# Patient Record
Sex: Female | Born: 1950 | Race: Black or African American | Hispanic: No | Smoking: Former smoker
Health system: Southern US, Community
[De-identification: ages and names within clinical notes are randomized; demographics above are authoritative.]

## PROBLEM LIST (undated history)

## (undated) DIAGNOSIS — Z972 Presence of dental prosthetic device (complete) (partial): Secondary | ICD-10-CM

## (undated) DIAGNOSIS — I10 Essential (primary) hypertension: Secondary | ICD-10-CM

## (undated) DIAGNOSIS — J45909 Unspecified asthma, uncomplicated: Secondary | ICD-10-CM

## (undated) DIAGNOSIS — H409 Unspecified glaucoma: Secondary | ICD-10-CM

## (undated) DIAGNOSIS — E079 Disorder of thyroid, unspecified: Secondary | ICD-10-CM

## (undated) DIAGNOSIS — N289 Disorder of kidney and ureter, unspecified: Secondary | ICD-10-CM

## (undated) DIAGNOSIS — M1990 Unspecified osteoarthritis, unspecified site: Secondary | ICD-10-CM

## (undated) DIAGNOSIS — E119 Type 2 diabetes mellitus without complications: Secondary | ICD-10-CM

## (undated) DIAGNOSIS — M109 Gout, unspecified: Secondary | ICD-10-CM

## (undated) HISTORY — PX: CHOLECYSTECTOMY: SHX55

## (undated) HISTORY — PX: MINOR CARPAL TUNNEL: SHX6472

## (undated) HISTORY — PX: TUBAL LIGATION: SHX77

---

## 1993-12-26 DIAGNOSIS — J45909 Unspecified asthma, uncomplicated: Secondary | ICD-10-CM | POA: Insufficient documentation

## 1994-02-23 DIAGNOSIS — I10 Essential (primary) hypertension: Secondary | ICD-10-CM | POA: Insufficient documentation

## 1994-02-23 DIAGNOSIS — I1A0 Resistant hypertension: Secondary | ICD-10-CM | POA: Insufficient documentation

## 1995-08-18 DIAGNOSIS — E039 Hypothyroidism, unspecified: Secondary | ICD-10-CM | POA: Insufficient documentation

## 2005-11-18 DIAGNOSIS — H409 Unspecified glaucoma: Secondary | ICD-10-CM | POA: Insufficient documentation

## 2005-11-18 DIAGNOSIS — M1990 Unspecified osteoarthritis, unspecified site: Secondary | ICD-10-CM | POA: Insufficient documentation

## 2005-11-18 DIAGNOSIS — N059 Unspecified nephritic syndrome with unspecified morphologic changes: Secondary | ICD-10-CM | POA: Insufficient documentation

## 2013-01-04 ENCOUNTER — Emergency Department: Payer: Self-pay | Admitting: Emergency Medicine

## 2013-06-07 ENCOUNTER — Emergency Department: Payer: Self-pay | Admitting: Emergency Medicine

## 2013-06-07 LAB — CBC
HCT: 39.1 % (ref 35.0–47.0)
HGB: 13.3 g/dL (ref 12.0–16.0)
MCH: 30.4 pg (ref 26.0–34.0)
MCHC: 34.1 g/dL (ref 32.0–36.0)
MCV: 89 fL (ref 80–100)
PLATELETS: 255 10*3/uL (ref 150–440)
RBC: 4.38 10*6/uL (ref 3.80–5.20)
RDW: 13.9 % (ref 11.5–14.5)
WBC: 14.3 10*3/uL — ABNORMAL HIGH (ref 3.6–11.0)

## 2013-06-07 LAB — BASIC METABOLIC PANEL
ANION GAP: 7 (ref 7–16)
BUN: 12 mg/dL (ref 7–18)
CALCIUM: 9.5 mg/dL (ref 8.5–10.1)
Chloride: 101 mmol/L (ref 98–107)
Co2: 26 mmol/L (ref 21–32)
Creatinine: 1.27 mg/dL (ref 0.60–1.30)
EGFR (Non-African Amer.): 45 — ABNORMAL LOW
GFR CALC AF AMER: 52 — AB
Glucose: 299 mg/dL — ABNORMAL HIGH (ref 65–99)
OSMOLALITY: 279 (ref 275–301)
Potassium: 3.4 mmol/L — ABNORMAL LOW (ref 3.5–5.1)
Sodium: 134 mmol/L — ABNORMAL LOW (ref 136–145)

## 2013-06-07 LAB — CK TOTAL AND CKMB (NOT AT ARMC)
CK, TOTAL: 127 U/L
CK-MB: 1.4 ng/mL (ref 0.5–3.6)

## 2013-06-07 LAB — TROPONIN I: Troponin-I: <0.02 ng/mL

## 2013-10-12 ENCOUNTER — Emergency Department: Payer: Self-pay | Admitting: Emergency Medicine

## 2013-10-12 LAB — COMPREHENSIVE METABOLIC PANEL
AST: 19 U/L (ref 15–37)
Albumin: 3.8 g/dL (ref 3.4–5.0)
Alkaline Phosphatase: 84 U/L
Anion Gap: 9 (ref 7–16)
BUN: 14 mg/dL (ref 7–18)
Bilirubin,Total: 0.4 mg/dL (ref 0.2–1.0)
CALCIUM: 9.6 mg/dL (ref 8.5–10.1)
CHLORIDE: 101 mmol/L (ref 98–107)
Co2: 26 mmol/L (ref 21–32)
Creatinine: 1.44 mg/dL — ABNORMAL HIGH (ref 0.60–1.30)
EGFR (Non-African Amer.): 39 — ABNORMAL LOW
GFR CALC AF AMER: 45 — AB
GLUCOSE: 198 mg/dL — AB (ref 65–99)
OSMOLALITY: 278 (ref 275–301)
POTASSIUM: 3.2 mmol/L — AB (ref 3.5–5.1)
SGPT (ALT): 21 U/L (ref 12–78)
Sodium: 136 mmol/L (ref 136–145)
TOTAL PROTEIN: 8.4 g/dL — AB (ref 6.4–8.2)

## 2013-10-12 LAB — CBC WITH DIFFERENTIAL/PLATELET
Basophil #: 0.1 10*3/uL (ref 0.0–0.1)
Basophil %: 0.8 %
Eosinophil #: 0.2 10*3/uL (ref 0.0–0.7)
Eosinophil %: 1.9 %
HCT: 36.6 % (ref 35.0–47.0)
HGB: 12.1 g/dL (ref 12.0–16.0)
LYMPHS PCT: 32.9 %
Lymphocyte #: 4.1 10*3/uL — ABNORMAL HIGH (ref 1.0–3.6)
MCH: 28.9 pg (ref 26.0–34.0)
MCHC: 33.1 g/dL (ref 32.0–36.0)
MCV: 87 fL (ref 80–100)
MONOS PCT: 6 %
Monocyte #: 0.7 x10 3/mm (ref 0.2–0.9)
NEUTROS ABS: 7.2 10*3/uL — AB (ref 1.4–6.5)
Neutrophil %: 58.4 %
Platelet: 326 10*3/uL (ref 150–440)
RBC: 4.19 10*6/uL (ref 3.80–5.20)
RDW: 15.6 % — ABNORMAL HIGH (ref 11.5–14.5)
WBC: 12.3 10*3/uL — ABNORMAL HIGH (ref 3.6–11.0)

## 2013-10-12 LAB — URIC ACID: Uric Acid: 8.1 mg/dL — ABNORMAL HIGH (ref 2.6–6.0)

## 2014-07-03 ENCOUNTER — Emergency Department: Payer: Self-pay | Admitting: Emergency Medicine

## 2014-08-01 DIAGNOSIS — N950 Postmenopausal bleeding: Secondary | ICD-10-CM | POA: Insufficient documentation

## 2015-01-03 ENCOUNTER — Encounter: Payer: Self-pay | Admitting: *Deleted

## 2015-01-03 ENCOUNTER — Emergency Department
Admission: EM | Admit: 2015-01-03 | Discharge: 2015-01-03 | Disposition: A | Discharge status: Home / Self Care | Payer: Medicare Other | Attending: Emergency Medicine | Admitting: Emergency Medicine

## 2015-01-03 DIAGNOSIS — M25562 Pain in left knee: Secondary | ICD-10-CM | POA: Diagnosis present

## 2015-01-03 DIAGNOSIS — E039 Hypothyroidism, unspecified: Secondary | ICD-10-CM | POA: Diagnosis not present

## 2015-01-03 DIAGNOSIS — I10 Essential (primary) hypertension: Secondary | ICD-10-CM | POA: Diagnosis not present

## 2015-01-03 DIAGNOSIS — E119 Type 2 diabetes mellitus without complications: Secondary | ICD-10-CM | POA: Insufficient documentation

## 2015-01-03 DIAGNOSIS — M10062 Idiopathic gout, left knee: Secondary | ICD-10-CM | POA: Diagnosis not present

## 2015-01-03 DIAGNOSIS — Z880 Allergy status to penicillin: Secondary | ICD-10-CM | POA: Diagnosis not present

## 2015-01-03 HISTORY — DX: Disorder of kidney and ureter, unspecified: N28.9

## 2015-01-03 HISTORY — DX: Type 2 diabetes mellitus without complications: E11.9

## 2015-01-03 HISTORY — DX: Essential (primary) hypertension: I10

## 2015-01-03 HISTORY — DX: Unspecified glaucoma: H40.9

## 2015-01-03 HISTORY — DX: Gout, unspecified: M10.9

## 2015-01-03 HISTORY — DX: Disorder of thyroid, unspecified: E07.9

## 2015-01-03 MED ORDER — COLCHICINE 0.6 MG PO TABS
ORAL_TABLET | ORAL | Status: AC
  Filled 2015-01-03: qty 2

## 2015-01-03 MED ORDER — COLCHICINE 0.6 MG PO TABS: 0.6000 mg | ORAL_TABLET | Freq: Every day | ORAL | Status: DC

## 2015-01-03 MED ORDER — NAPROXEN 500 MG PO TABS
ORAL_TABLET | ORAL | Status: AC
  Filled 2015-01-03: qty 1

## 2015-01-03 MED ORDER — NAPROXEN 500 MG PO TABS
500.0000 mg | ORAL_TABLET | Freq: Once | ORAL | Status: AC
  Administered 2015-01-03: 500 mg via ORAL

## 2015-01-03 MED ORDER — NAPROXEN 500 MG PO TABS: 500.0000 mg | ORAL_TABLET | Freq: Two times a day (BID) | ORAL | Status: DC

## 2015-01-03 MED ORDER — COLCHICINE 0.6 MG PO TABS
1.2000 mg | ORAL_TABLET | Freq: Once | ORAL | Status: AC
  Administered 2015-01-03: 1.2 mg via ORAL

## 2015-01-03 NOTE — Discharge Instructions (Signed)
Gout °Gout is when your joints become red, sore, and swell (inflamed). This is caused by the buildup of uric acid crystals in the joints. Uric acid is a chemical that is normally in the blood. If the level of uric acid gets too high in the blood, these crystals form in your joints and tissues. Over time, these crystals can form into masses near the joints and tissues. These masses can destroy bone and cause the bone to look misshapen (deformed). °HOME CARE  °· Do not take aspirin for pain. °· Only take medicine as told by your doctor. °· Rest the joint as much as you can. When in bed, keep sheets and blankets off painful areas. °· Keep the sore joints raised (elevated). °· Put warm or cold packs on painful joints. Use of warm or cold packs depends on which works best for you. °· Use crutches if the painful joint is in your leg. °· Drink enough fluids to keep your pee (urine) clear or pale yellow. Limit alcohol, sugary drinks, and drinks with fructose in them. °· Follow your diet instructions. Pay careful attention to how much protein you eat. Include fruits, vegetables, whole grains, and fat-free or low-fat milk products in your daily diet. Talk to your doctor or dietitian about the use of coffee, vitamin C, and cherries. These may help lower uric acid levels. °· Keep a healthy body weight. °GET HELP RIGHT AWAY IF:  °· You have watery poop (diarrhea), throw up (vomit), or have any side effects from medicines. °· You do not feel better in 24 hours, or you are getting worse. °· Your joint becomes suddenly more tender, and you have chills or a fever. °MAKE SURE YOU:  °· Understand these instructions. °· Will watch your condition. °· Will get help right away if you are not doing well or get worse. °Document Released: 01/07/2008 Document Revised: 08/14/2013 Document Reviewed: 11/11/2011 °[employer]® Patient Information ©2015 [employer]. This information is not intended to replace advice given to you by your health care  provider. Make sure you discuss any questions you have with your health care provider. ° °

## 2015-01-03 NOTE — ED Provider Notes (Signed)
[facility] Emergency Department Provider Note  ____________________________________________  Time seen: Approximately 9:25 PM  I have reviewed the triage vital signs and the nursing notes.   HISTORY  Chief Complaint Knee Pain    HPI Shari Malone is a 64 y.o. female patient complain of pain he edema to the left knee. Patient state onset 3 days ago. Patient has a history of gout currently not taking any medicine for this condition. Patient is rating her pain as a 8/10 described as sharp. Patient ambulates with cane. No palliative measures were discussed complaint.  Past Medical History  Diagnosis Date  . Gout   . Hypertension   . Diabetes mellitus without complication   . Thyroid disease   . Glaucoma   . Renal disorder     There are no active problems to display for this patient.   History reviewed. No pertinent past surgical history.  Current Outpatient Rx  Name  Route  Sig  Dispense  Refill  . colchicine 0.6 MG tablet   Oral   Take 1 tablet (0.6 mg total) by mouth daily.   30 tablet   0   . naproxen (NAPROSYN) 500 MG tablet   Oral   Take 1 tablet (500 mg total) by mouth 2 (two) times daily with a meal.   20 tablet   00     Allergies Amoxicillin and Penicillins  No family history on file.  Social History Social History  Substance Use Topics  . Smoking status: Never Smoker   . Smokeless tobacco: None  . Alcohol Use: No    Review of Systems Constitutional: No fever/chills Eyes: No visual changes. ENT: No sore throat. Cardiovascular: Denies chest pain. Respiratory: Denies shortness of breath. Gastrointestinal: No abdominal pain.  No nausea, no vomiting.  No diarrhea.  No constipation. Genitourinary: Negative for dysuria. Musculoskeletal: Negative for back pain. Skin: Negative for rash. Neurological: Negative for headaches, focal weakness or numbness. Endocrine:Hypertension, diabetes, and hypothyroidism.   Allergic/Immunilogical: Penicillin 10-point ROS otherwise negative.  ____________________________________________   PHYSICAL EXAM:  VITAL SIGNS: ED Triage Vitals  Enc Vitals Group     BP 01/03/15 2051 160/64 mmHg     Pulse Rate 01/03/15 2051 62     Resp 01/03/15 2051 18     Temp 01/03/15 2051 97.6 F (36.4 C)     Temp Source 01/03/15 2051 Oral     SpO2 01/03/15 2051 100 %     Weight 01/03/15 2051 174 lb (78.926 kg)     Height 01/03/15 2051 4\' 11"  (1.499 m)     Head Cir --      Peak Flow --      Pain Score 01/03/15 2052 8     Pain Loc --      Pain Edu? --      Excl. in GC? --     Constitutional: Alert and oriented. Well appearing and in no acute distress. Eyes: Conjunctivae are normal. PERRL. EOMI. Head: Atraumatic. Nose: No congestion/rhinnorhea. Mouth/Throat: Mucous membranes are moist.  Oropharynx non-erythematous. Neck: No stridor.   Hematological/Lymphatic/Immunilogical: No cervical lymphadenopathy. Cardiovascular: Normal rate, regular rhythm. Grossly normal heart sounds.  Good peripheral circulation. Respiratory: Normal respiratory effort.  No retractions. Lungs CTAB. Gastrointestinal: Soft and nontender. No distention. No abdominal bruits. No CVA tenderness. Musculoskeletal: He edema left knee. Moderate guarding palpation. Neurovascular intact. Free and equal range of motion of motion.  Neurologic:  Normal speech and language. No gross focal neurologic deficits are appreciated. No gait  instability. Skin:  Skin is warm, dry and intact. No rash noted. Psychiatric: Mood and affect are normal. Speech and behavior are normal.  ____________________________________________   LABS (all labs ordered are listed, but only abnormal results are displayed)  Labs Reviewed - No data to  display ____________________________________________  EKG   ____________________________________________  RADIOLOGY   ____________________________________________   PROCEDURES  Procedure(s) performed: None  Critical Care performed: No  ____________________________________________   INITIAL IMPRESSION / ASSESSMENT AND PLAN / ED COURSE  Pertinent labs & imaging results that were available during my care of the patient were reviewed by me and considered in my medical decision making (see chart for details).  Gout left knee. Patient given Colchicine and naproxen in the ED. Patient prescribed same medication and advised to follow-up with family doctor for continued care. Return back if condition worsens. ____________________________________________   FINAL CLINICAL IMPRESSION(S) / ED DIAGNOSES  Final diagnoses:  Acute idiopathic gout of left knee      Joni Reining, PA-C 01/03/15 2136  Phineas Semen, MD 01/04/15 [id number]

## 2015-01-03 NOTE — ED Notes (Signed)
Patient c/o left knee pain for 3 days. Patient states she has a history of gout.

## 2015-01-03 NOTE — ED Notes (Signed)
Pt c/o pain to R knee. Pt sts pain began 3 days ago.  Pt sts "it feels like gout" and denies injury.

## 2015-07-25 ENCOUNTER — Emergency Department: Payer: Medicare Other

## 2015-07-25 ENCOUNTER — Emergency Department
Admission: EM | Admit: 2015-07-25 | Discharge: 2015-07-25 | Disposition: A | Discharge status: Home / Self Care | Payer: Medicare Other | Attending: Emergency Medicine | Admitting: Emergency Medicine

## 2015-07-25 DIAGNOSIS — H409 Unspecified glaucoma: Secondary | ICD-10-CM | POA: Diagnosis not present

## 2015-07-25 DIAGNOSIS — I10 Essential (primary) hypertension: Secondary | ICD-10-CM | POA: Insufficient documentation

## 2015-07-25 DIAGNOSIS — E119 Type 2 diabetes mellitus without complications: Secondary | ICD-10-CM | POA: Insufficient documentation

## 2015-07-25 DIAGNOSIS — Z791 Long term (current) use of non-steroidal anti-inflammatories (NSAID): Secondary | ICD-10-CM | POA: Diagnosis not present

## 2015-07-25 DIAGNOSIS — G8929 Other chronic pain: Secondary | ICD-10-CM | POA: Insufficient documentation

## 2015-07-25 DIAGNOSIS — N289 Disorder of kidney and ureter, unspecified: Secondary | ICD-10-CM | POA: Diagnosis not present

## 2015-07-25 DIAGNOSIS — M545 Low back pain: Secondary | ICD-10-CM | POA: Diagnosis present

## 2015-07-25 MED ORDER — MELOXICAM 7.5 MG PO TABS
ORAL_TABLET | ORAL | Status: AC
  Filled 2015-07-25: qty 2

## 2015-07-25 MED ORDER — MELOXICAM 7.5 MG PO TABS
15.0000 mg | ORAL_TABLET | Freq: Once | ORAL | Status: AC
  Administered 2015-07-25: 15 mg via ORAL

## 2015-07-25 MED ORDER — CYCLOBENZAPRINE HCL 10 MG PO TABS: 10.0000 mg | ORAL_TABLET | Freq: Three times a day (TID) | ORAL | Status: DC | PRN

## 2015-07-25 MED ORDER — CYCLOBENZAPRINE HCL 10 MG PO TABS
5.0000 mg | ORAL_TABLET | Freq: Once | ORAL | Status: AC
  Administered 2015-07-25: 5 mg via ORAL

## 2015-07-25 MED ORDER — CYCLOBENZAPRINE HCL 10 MG PO TABS
ORAL_TABLET | ORAL | Status: AC
  Filled 2015-07-25: qty 1

## 2015-07-25 MED ORDER — MELOXICAM 15 MG PO TABS: 15.0000 mg | ORAL_TABLET | Freq: Every day | ORAL | Status: DC

## 2015-07-25 NOTE — Discharge Instructions (Signed)

## 2015-07-25 NOTE — ED Notes (Signed)
Patient ambulatory to triage with steady gait, without difficulty or distress noted; pt reports lower back pain with unknown injury; denies radiating pain, denies accomp symptoms; st has seen PCP for same and rx PT therapy but never went; took tylenol this am without relief

## 2015-07-25 NOTE — ED Notes (Signed)
  Reviewed d/c instructions, follow-up care, and prescriptions with pt. Pt verbalized understanding 

## 2015-07-25 NOTE — ED Provider Notes (Signed)
[facility] Emergency Department Provider Note  ____________________________________________  Time seen: Approximately 8:30 PM  I have reviewed the triage vital signs and the nursing notes.   HISTORY  Chief Complaint Back Pain    HPI Shari Malone is a 65 y.o. female who presents emergency department complaining of lower back pain. Patient states that symptoms have been ongoing 1 month. She was seen by her primary care provider and was told that she needs follow-up with physical therapy which she did not do. Patient states that she does not know the diagnosis for her primary care. She denies any history of sciatica, degenerative disc disease. She denies any recent history of injury. Patient denies any saddle anesthesia, bowel or bladder dysfunction, paresthesias.Patient denies any urinary complaints. Patient states that there is no pain while sitting or laying down but states that there is sharp pain upon movement and ambulation.   Past Medical History  Diagnosis Date  . Gout   . Hypertension   . Diabetes mellitus without complication   . Thyroid disease   . Glaucoma   . Renal disorder     There are no active problems to display for this patient.   No past surgical history on file.  Current Outpatient Rx  Name  Route  Sig  Dispense  Refill  . colchicine 0.6 MG tablet   Oral   Take 1 tablet (0.6 mg total) by mouth daily.   30 tablet   0   . cyclobenzaprine (FLEXERIL) 10 MG tablet   Oral   Take 1 tablet (10 mg total) by mouth 3 (three) times daily as needed for muscle spasms.   15 tablet   0   . meloxicam (MOBIC) 15 MG tablet   Oral   Take 1 tablet (15 mg total) by mouth daily.   30 tablet   0   . naproxen (NAPROSYN) 500 MG tablet   Oral   Take 1 tablet (500 mg total) by mouth 2 (two) times daily with a meal.   20 tablet   00     Allergies Amoxicillin and Penicillins  No family history on file.  Social History Social History   Substance Use Topics  . Smoking status: Never Smoker   . Smokeless tobacco: Not on file  . Alcohol Use: No     Review of Systems  Constitutional: No fever/chills Cardiovascular: no chest pain. Respiratory: no cough. No SOB. Gastrointestinal: No abdominal pain.  No nausea, no vomiting.  No diarrhea.  No constipation. Genitourinary: Negative for dysuria. No hematuria Musculoskeletal: Positive for lower back pain. Skin: Negative for rash. Neurological: Negative for headaches, focal weakness or numbness. 10-point ROS otherwise negative.  ____________________________________________   PHYSICAL EXAM:  VITAL SIGNS: ED Triage Vitals  Enc Vitals Group     BP 07/25/15 1950 157/58 mmHg     Pulse Rate 07/25/15 1950 60     Resp 07/25/15 1950 16     Temp 07/25/15 1950 97.7 F (36.5 C)     Temp Source 07/25/15 1950 Oral     SpO2 07/25/15 1950 100 %     Weight 07/25/15 1950 170 lb (77.111 kg)     Height 07/25/15 1950 4\' 11"  (1.499 m)     Head Cir --      Peak Flow --      Pain Score 07/25/15 1937 8     Pain Loc --      Pain Edu? --      Excl.  in GC? --      Constitutional: Alert and oriented. Well appearing and in no acute distress. Eyes: Conjunctivae are normal. PERRL. EOMI. Head: Atraumatic. Cardiovascular: Normal rate, regular rhythm. Normal S1 and S2.  Good peripheral circulation. Respiratory: Normal respiratory effort without tachypnea or retractions. Lungs CTAB. Gastrointestinal:  No CVA tenderness. Musculoskeletal: No visible deformity. Patient is tender to palpation midline spinal processes in the upper lumbar region. No point tenderness. Patient is nontender to palpation over bilateral sciatic notches. Negative straight leg raise bilaterally. Sensation intact and equal lower extremities. Dorsalis pedis pulse is appreciated bilaterally. Neurologic:  Normal speech and language. No gross focal neurologic deficits are appreciated.  Skin:  Skin is warm, dry and intact. No  rash noted. Psychiatric: Mood and affect are normal. Speech and behavior are normal. Patient exhibits appropriate insight and judgement.   ____________________________________________   LABS (all labs ordered are listed, but only abnormal results are displayed)  Labs Reviewed - No data to display ____________________________________________  EKG   ____________________________________________  RADIOLOGY Festus Barren Cuthriell, personally viewed and evaluated these images (plain radiographs) as part of my medical decision making, as well as reviewing the written report by the radiologist.  Dg Lumbar Spine Complete  07/25/2015  CLINICAL DATA:  Lumbago for 1 month EXAM: LUMBAR SPINE - COMPLETE 4+ VIEW COMPARISON:  None. FINDINGS: Frontal, lateral, spot lumbosacral lateral, and bilateral oblique views were obtained. There are 5 non-rib-bearing lumbar type vertebral bodies. There is mild dextrorotoscoliosis. There is no fracture or spondylolisthesis. There is slight disc space narrowing at L5-S1. The other disc spaces appear normal. There is no appreciable facet arthropathy. IMPRESSION: Mild scoliosis. No fracture or spondylolisthesis. Mild disc space narrowing at L5-S1. Electronically Signed   By: Bretta Bang III M.D.   On: 07/25/2015 20:55    ____________________________________________    PROCEDURES  Procedure(s) performed:       Medications - No data to display   ____________________________________________   INITIAL IMPRESSION / ASSESSMENT AND PLAN / ED COURSE  Pertinent labs & imaging results that were available during my care of the patient were reviewed by me and considered in my medical decision making (see chart for details).  Patient's diagnosis is consistent with lumbago without sciatica. X-ray reveals no acute osseous abnormality but did show some mild scoliosis with mild narrowing of the L5-S1 joint space.. Patient will be discharged home with  prescriptions for anti-inflammatories and muscle relaxers. Patient is to follow up with primary care provider if symptoms persist past this treatment course. Patient is given ED precautions to return to the ED for any worsening or new symptoms.     ____________________________________________  FINAL CLINICAL IMPRESSION(S) / ED DIAGNOSES  Final diagnoses:  Chronic midline low back pain without sciatica      NEW MEDICATIONS STARTED DURING THIS VISIT:  New Prescriptions   CYCLOBENZAPRINE (FLEXERIL) 10 MG TABLET    Take 1 tablet (10 mg total) by mouth 3 (three) times daily as needed for muscle spasms.   MELOXICAM (MOBIC) 15 MG TABLET    Take 1 tablet (15 mg total) by mouth daily.        This chart was dictated using voice recognition software/Dragon. Despite best efforts to proofread, errors can occur which can change the meaning. Any change was purely unintentional.    Racheal Patches, PA-C 07/25/15 2113  Phineas Semen, MD 07/25/15 2157

## 2015-07-25 NOTE — ED Notes (Signed)
Pt reports left lower back pain x1 month. Pt reports being directed to follow-up with physical therapy by her PCP, but failed to do so. Pt denies being told what might be causing her pain, denies any known trauma or injury, denies any previous diagnosis of sciatica, DDD, or other degenerative/physiological back conditions. Pt states no pain at rest, but reports pain increases upon standing, ambulation, and "fast movements".

## 2015-11-25 ENCOUNTER — Encounter: Payer: Self-pay | Admitting: Emergency Medicine

## 2015-11-25 ENCOUNTER — Emergency Department: Payer: Medicare Other

## 2015-11-25 ENCOUNTER — Emergency Department
Admission: EM | Admit: 2015-11-25 | Discharge: 2015-11-25 | Disposition: A | Discharge status: Home / Self Care | Payer: Medicare Other | Attending: Emergency Medicine | Admitting: Emergency Medicine

## 2015-11-25 DIAGNOSIS — M10041 Idiopathic gout, right hand: Secondary | ICD-10-CM | POA: Diagnosis not present

## 2015-11-25 DIAGNOSIS — M79641 Pain in right hand: Secondary | ICD-10-CM | POA: Diagnosis present

## 2015-11-25 DIAGNOSIS — I10 Essential (primary) hypertension: Secondary | ICD-10-CM | POA: Diagnosis not present

## 2015-11-25 DIAGNOSIS — E119 Type 2 diabetes mellitus without complications: Secondary | ICD-10-CM | POA: Diagnosis not present

## 2015-11-25 MED ORDER — HYDROCODONE-ACETAMINOPHEN 5-325 MG PO TABS: 1.0000 | ORAL_TABLET | ORAL | 0 refills | Status: DC | PRN

## 2015-11-25 MED ORDER — INDOMETHACIN 50 MG PO CAPS: 50.0000 mg | ORAL_CAPSULE | Freq: Two times a day (BID) | ORAL | 0 refills | Status: DC

## 2015-11-25 MED ORDER — COLCHICINE 0.6 MG PO TABS: 1.2000 mg | ORAL_TABLET | Freq: Once | ORAL | 0 refills | Status: DC

## 2015-11-25 NOTE — ED Provider Notes (Signed)
[facility] Emergency Department Provider Note  ____________________________________________  Time seen: Approximately 5:00 PM  I have reviewed the triage vital signs and the nursing notes.   HISTORY  Chief Complaint Wrist Pain and Hand Pain    HPI Shari Malone is a 65 y.o. female who presents for evaluation of right wrist and hand pain. Patient states symptoms 6 days. Patient denies any known trauma. Increased pain with pronation to supination. Patient reports past medical history of gout   Past Medical History:  Diagnosis Date  . Diabetes mellitus without complication (HCC)   . Glaucoma   . Gout   . Hypertension   . Renal disorder   . Thyroid disease     There are no active problems to display for this patient.   History reviewed. No pertinent surgical history.  Prior to Admission medications   Medication Sig Start Date End Date Taking? Authorizing Provider  colchicine 0.6 MG tablet Take 2 tablets (1.2 mg total) by mouth once. 11/25/15 11/25/15  Charmayne Sheer Beers, PA-C  HYDROcodone-acetaminophen (NORCO) 5-325 MG tablet Take 1-2 tablets by mouth every 4 (four) hours as needed for moderate pain. 11/25/15   Evangeline Dakin, PA-C  indomethacin (INDOCIN) 50 MG capsule Take 1 capsule (50 mg total) by mouth 2 (two) times daily with a meal. 11/25/15   Evangeline Dakin, PA-C    Allergies Amoxicillin and Penicillins  No family history on file.  Social History Social History  Substance Use Topics  . Smoking status: Never Smoker  . Smokeless tobacco: Never Used  . Alcohol use No    Review of Systems Constitutional: No fever/chills Cardiovascular: Denies chest pain. Respiratory: Denies shortness of breath. Musculoskeletal: Positive for right hand and wrist pain. Skin: Negative for rash. Neurological: Negative for headaches, focal weakness or numbness.  10-point ROS otherwise negative.  ____________________________________________   PHYSICAL  EXAM:  VITAL SIGNS: ED Triage Vitals  Enc Vitals Group     BP 11/25/15 1646 139/74     Pulse Rate 11/25/15 1646 (!) 59     Resp 11/25/15 1646 18     Temp 11/25/15 1646 98.3 F (36.8 C)     Temp Source 11/25/15 1646 Oral     SpO2 11/25/15 1646 98 %     Weight 11/25/15 1647 168 lb (76.2 kg)     Height 11/25/15 1647 4\' 11"  (1.499 m)     Head Circumference --      Peak Flow --      Pain Score 11/25/15 1647 6     Pain Loc --      Pain Edu? --      Excl. in GC? --     Constitutional: Alert and oriented. Well appearing and in no acute distress.  Cardiovascular: Normal rate, regular rhythm. Grossly normal heart sounds.  Good peripheral circulation. Respiratory: Normal respiratory effort.  No retractions. Lungs CTAB. Musculoskeletal: No lower extremity tenderness nor edema.  No joint effusions.Right wrist with edema and warmth noted. Distally neurovascularly intact. Limited range of motion increased pain with flexion and pronation. Neurologic:  Normal speech and language. No gross focal neurologic deficits are appreciated. Skin:  Skin is warm, dry and intact. No rash noted. Psychiatric: Mood and affect are normal. Speech and behavior are normal.  ____________________________________________   LABS (all labs ordered are listed, but only abnormal results are displayed)  Labs Reviewed - No data to display ____________________________________________  EKG   ____________________________________________  RADIOLOGY  IMPRESSION: No acute findings.  Arthropathy with well-marginated erosions involving the hamate and multiple fingers, consistent with known history of gout. ____________________________________________   PROCEDURES  Procedure(s) performed: None  Critical Care performed: No  ____________________________________________   INITIAL IMPRESSION / ASSESSMENT AND PLAN / ED COURSE  Pertinent labs & imaging results that were available during my care of the patient  were reviewed by me and considered in my medical decision making (see chart for details). Review of the [place] CSRS was performed in accordance of the [facility] prior to dispensing any controlled drugs.  Acute exacerbation of gout to the right hand. Rx given for colchicine and Indocin. Vicodin 5/325. Patient follow-up PCP or return to ER with any worsening symptomology.  Clinical Course    ____________________________________________   FINAL CLINICAL IMPRESSION(S) / ED DIAGNOSES  Final diagnoses:  Acute idiopathic gout of right hand     This chart was dictated using voice recognition software/Dragon. Despite best efforts to proofread, errors can occur which can change the meaning. Any change was purely unintentional.    Evangeline Dakin, PA-C 11/25/15 1821    Nita Sickle, MD 11/25/15 2055

## 2015-11-25 NOTE — ED Notes (Signed)
States she developed pain to right wrist last weds.  Denies any injury  Right wrist swollen and tender to touch  Positive pulses  Hx of gout

## 2015-11-25 NOTE — ED Triage Notes (Signed)
Patient presents to the ED with right wrist pain, thumb and forefinger pain since Wednesday.  Patient reports history of arthritis and states this feels similarly.  Patient is in no obvious distress at this time.

## 2016-03-13 ENCOUNTER — Emergency Department
Admission: EM | Admit: 2016-03-13 | Discharge: 2016-03-13 | Disposition: A | Discharge status: Home / Self Care | Payer: Medicare Other | Attending: Emergency Medicine | Admitting: Emergency Medicine

## 2016-03-13 ENCOUNTER — Encounter: Payer: Self-pay | Admitting: Emergency Medicine

## 2016-03-13 DIAGNOSIS — Y9301 Activity, walking, marching and hiking: Secondary | ICD-10-CM | POA: Diagnosis not present

## 2016-03-13 DIAGNOSIS — Y929 Unspecified place or not applicable: Secondary | ICD-10-CM | POA: Diagnosis not present

## 2016-03-13 DIAGNOSIS — E119 Type 2 diabetes mellitus without complications: Secondary | ICD-10-CM | POA: Insufficient documentation

## 2016-03-13 DIAGNOSIS — Z043 Encounter for examination and observation following other accident: Secondary | ICD-10-CM | POA: Diagnosis present

## 2016-03-13 DIAGNOSIS — Y999 Unspecified external cause status: Secondary | ICD-10-CM | POA: Insufficient documentation

## 2016-03-13 DIAGNOSIS — W19XXXA Unspecified fall, initial encounter: Secondary | ICD-10-CM

## 2016-03-13 DIAGNOSIS — Z79899 Other long term (current) drug therapy: Secondary | ICD-10-CM | POA: Diagnosis not present

## 2016-03-13 DIAGNOSIS — I10 Essential (primary) hypertension: Secondary | ICD-10-CM | POA: Insufficient documentation

## 2016-03-13 DIAGNOSIS — W1839XA Other fall on same level, initial encounter: Secondary | ICD-10-CM | POA: Insufficient documentation

## 2016-03-13 DIAGNOSIS — J45909 Unspecified asthma, uncomplicated: Secondary | ICD-10-CM | POA: Diagnosis not present

## 2016-03-13 HISTORY — DX: Unspecified asthma, uncomplicated: J45.909

## 2016-03-13 MED ORDER — ACETAMINOPHEN 325 MG PO TABS: 325.0000 mg | ORAL_TABLET | ORAL | 2 refills | Status: AC | PRN

## 2016-03-13 MED ORDER — ACETAMINOPHEN 325 MG PO TABS
650.0000 mg | ORAL_TABLET | Freq: Once | ORAL | Status: AC
  Administered 2016-03-13: 650 mg via ORAL

## 2016-03-13 MED ORDER — ACETAMINOPHEN 325 MG PO TABS
ORAL_TABLET | ORAL | Status: AC
  Filled 2016-03-13: qty 2

## 2016-03-13 NOTE — ED Provider Notes (Signed)
[facility] Emergency Department Provider Note ____________________________________________  Time seen: Approximately 11:23 PM  I have reviewed the triage vital signs and the nursing notes.   HISTORY  Chief Complaint Fall    HPI Shari Malone is a 65 y.o. female that fell tonight while walking at a parade. Patient states that she "caught herself" with her hands. However, she states that her cheek did make contact with the concrete. Patient denies loss of consciousness. She denies nausea, vomiting or blurry vision. She states that "I don't really have pain now". Patient states that her family wanted her to seek care at the emergency department for reassurance. She does not have a history of CVA and takes an aspirin daily. She resides with 2 family members.  Past Medical History:  Diagnosis Date  . Asthma   . Diabetes mellitus without complication (HCC)   . Glaucoma   . Gout   . Hypertension   . Renal disorder   . Thyroid disease     There are no active problems to display for this patient.   Past Surgical History:  Procedure Laterality Date  . CHOLECYSTECTOMY    . MINOR CARPAL TUNNEL    . TUBAL LIGATION      Prior to Admission medications   Medication Sig Start Date End Date Taking? Authorizing Provider  acetaminophen (TYLENOL) 325 MG tablet Take 1 tablet (325 mg total) by mouth every 4 (four) hours as needed. 03/13/16 03/13/17  Orvil Feil, PA-C  colchicine 0.6 MG tablet Take 2 tablets (1.2 mg total) by mouth once. 11/25/15 11/25/15  Charmayne Sheer Beers, PA-C  HYDROcodone-acetaminophen (NORCO) 5-325 MG tablet Take 1-2 tablets by mouth every 4 (four) hours as needed for moderate pain. 11/25/15   Evangeline Dakin, PA-C  indomethacin (INDOCIN) 50 MG capsule Take 1 capsule (50 mg total) by mouth 2 (two) times daily with a meal. 11/25/15   Evangeline Dakin, PA-C    Allergies Amoxicillin and Penicillins  No family history on file.  Social History Social  History  Substance Use Topics  . Smoking status: Never Smoker  . Smokeless tobacco: Never Used  . Alcohol use No    Review of Systems Constitutional: No recent illness. Cardiovascular: Denies chest pain or palpitations. Respiratory: Denies shortness of breath. Musculoskeletal: Denies pain in the arms, shoulders and wrists bilaterally.  Skin: Negative for rash, wound, lesion. Neurological: Negative for focal weakness or numbness.  ____________________________________________   PHYSICAL EXAM:  VITAL SIGNS: ED Triage Vitals  Enc Vitals Group     BP 03/13/16 2137 (!) 169/57     Pulse Rate 03/13/16 2137 (!) 57     Resp 03/13/16 2137 20     Temp 03/13/16 2137 97.8 F (36.6 C)     Temp Source 03/13/16 2137 Oral     SpO2 03/13/16 2137 99 %     Weight 03/13/16 2136 170 lb (77.1 kg)     Height 03/13/16 2136 4\' 11"  (1.499 m)     Head Circumference --      Peak Flow --      Pain Score 03/13/16 2139 4     Pain Loc --      Pain Edu? --      Excl. in GC? --     Constitutional: Alert and oriented. Well appearing and in no acute distress. Eyes: Conjunctivae are normal. EOMI.Accommodation within normal limits. Pupils equal round and reactive to light bilaterally. Patient has no tenderness to palpation at the  inferior orbit bilaterally. Head: Atraumatic. Neck: Patient has full range of motion at the neck with no tenderness along the C-spine. Respiratory: Normal respiratory effort.   Musculoskeletal: Patient has some mild decreased grip strength due to rheumatoid arthritis. Bouchard's nodes visualized bilaterally. She has range of motion within reference range at the wrists, elbows, and shoulders bilaterally. Reflexes are 2+ and symmetrical. Patient is able to perform pronation and supination bilaterally. Neurologic:  Normal speech and language. No gross focal neurologic deficits are appreciated. Cranial nerves: 2-10 normal as tested. Cerebellar: Finger-nose-finger WNL, heel to shin  WNL. Sensorimotor: No pronator drift, clonus, sensory loss or abnormal reflexes. Vision: No visual field deficts noted to confrontation.  Speech: No dysarthria or expressive aphasia.  Skin:  Skin is warm, dry and intact. Atraumatic. Psychiatric: Mood and affect are normal. Speech and behavior are normal.  ____________________________________________   LABS (all labs ordered are listed, but only abnormal results are displayed)  Labs Reviewed - No data to display ____________________________________________   PROCEDURES  Procedure(s) performed: Tylenol  ____________________________________________   INITIAL IMPRESSION / ASSESSMENT AND PLAN / ED COURSE  Clinical Course     Pertinent labs & imaging results that were available during my care of the patient were reviewed by me and considered in my medical decision making (see chart for details).  Assessment and plan:  Fall, initial encounter Patient does not report headache or extremity pain at this time. She is alert and oriented to time, situation and place. She denies loss of consciousness, nausea, vomiting and neck pain. Further imaging is not warranted at this time. Patient was advised to use Tylenol as needed for discomfort. Strict return precautions were given. All patient questions were answered.  ____________________________________________   FINAL CLINICAL IMPRESSION(S) / ED DIAGNOSES  Final diagnoses:  Fall, initial encounter       Orvil Feil, PA-C 03/13/16 2332    Phineas Semen, MD 03/15/16 [phone]

## 2016-03-13 NOTE — ED Triage Notes (Signed)
Pt states that she walking at the parade this evening and stepped onto a cement step and fell. Pt takes one baby aspirin a day and denies LOC. Pt states that she caught most of her weight with her hands but does not believe they are broken and pt is able to flex and curl fingers without increased difficulty. Pt is ambulatory to triage with NAD at this time.

## 2016-03-13 NOTE — ED Notes (Signed)
Pt also states that when she fell her right cheek hit the pavement but that she caught most of her weight with her hands. Slight reddened area visible to right cheek with little to no swelling noted.

## 2016-03-17 DIAGNOSIS — N393 Stress incontinence (female) (male): Secondary | ICD-10-CM | POA: Insufficient documentation

## 2016-05-24 ENCOUNTER — Emergency Department
Admission: EM | Admit: 2016-05-24 | Discharge: 2016-05-24 | Disposition: A | Discharge status: Home / Self Care | Payer: Medicare Other | Attending: Emergency Medicine | Admitting: Emergency Medicine

## 2016-05-24 DIAGNOSIS — Z79899 Other long term (current) drug therapy: Secondary | ICD-10-CM | POA: Diagnosis not present

## 2016-05-24 DIAGNOSIS — M62838 Other muscle spasm: Secondary | ICD-10-CM | POA: Diagnosis not present

## 2016-05-24 DIAGNOSIS — I10 Essential (primary) hypertension: Secondary | ICD-10-CM | POA: Diagnosis not present

## 2016-05-24 DIAGNOSIS — J45909 Unspecified asthma, uncomplicated: Secondary | ICD-10-CM | POA: Diagnosis not present

## 2016-05-24 DIAGNOSIS — M542 Cervicalgia: Secondary | ICD-10-CM | POA: Diagnosis present

## 2016-05-24 DIAGNOSIS — E119 Type 2 diabetes mellitus without complications: Secondary | ICD-10-CM | POA: Insufficient documentation

## 2016-05-24 MED ORDER — CYCLOBENZAPRINE HCL 5 MG PO TABS: 5.0000 mg | ORAL_TABLET | Freq: Three times a day (TID) | ORAL | 0 refills | Status: AC | PRN

## 2016-05-24 NOTE — ED Triage Notes (Signed)
Pt states that she started with neck pain and muscle spasm on Friday, denies injury

## 2016-05-24 NOTE — ED Provider Notes (Signed)
[facility] Emergency Department Provider Note  ____________________________________________  Time seen: Approximately 5:24 PM  I have reviewed the triage vital signs and the nursing notes.   HISTORY  Chief Complaint Neck Pain    HPI Shari Malone is a 66 y.o. female with a history of diabetes presents to the emergency department with 9/10  aching bilateral upper trapezius pain that started on 05/22/2016. Patient denies new falls or instances of trauma. Patient denies fever, congestion, rhinorrhea or recent illness. Patient states that she has tried one of her daughters muscle relaxers, which relieved her symptoms some. Patient states that she has experienced upper trapezius pain in the past. She denies headache, radiculopathy, weakness, chest pain, chest tightness, shortness of breath, abdominal pain and vomiting.   Past Medical History:  Diagnosis Date  . Asthma   . Diabetes mellitus without complication (HCC)   . Glaucoma   . Gout   . Hypertension   . Renal disorder   . Thyroid disease     There are no active problems to display for this patient.   Past Surgical History:  Procedure Laterality Date  . CHOLECYSTECTOMY    . MINOR CARPAL TUNNEL    . TUBAL LIGATION      Prior to Admission medications   Medication Sig Start Date End Date Taking? Authorizing Provider  acetaminophen (TYLENOL) 325 MG tablet Take 1 tablet (325 mg total) by mouth every 4 (four) hours as needed. 03/13/16 03/13/17  Orvil Feil, PA-C  colchicine 0.6 MG tablet Take 2 tablets (1.2 mg total) by mouth once. 11/25/15 11/25/15  Charmayne Sheer Beers, PA-C  cyclobenzaprine (FLEXERIL) 5 MG tablet Take 1 tablet (5 mg total) by mouth 3 (three) times daily as needed for muscle spasms. 05/24/16 05/27/16  Orvil Feil, PA-C  HYDROcodone-acetaminophen (NORCO) 5-325 MG tablet Take 1-2 tablets by mouth every 4 (four) hours as needed for moderate pain. 11/25/15   Evangeline Dakin, PA-C  indomethacin  (INDOCIN) 50 MG capsule Take 1 capsule (50 mg total) by mouth 2 (two) times daily with a meal. 11/25/15   Evangeline Dakin, PA-C    Allergies Amoxicillin and Penicillins  No family history on file.  Social History Social History  Substance Use Topics  . Smoking status: Never Smoker  . Smokeless tobacco: Never Used  . Alcohol use No     Review of Systems  Constitutional: No fever/chills Eyes: No visual changes. No discharge ENT: No upper respiratory complaints. Cardiovascular: no chest pain. Respiratory: no cough. No SOB. Gastrointestinal: No abdominal pain.  No nausea, no vomiting.  No diarrhea.  No constipation. Genitourinary: Negative for dysuria. No hematuria Musculoskeletal: Patient has trapezius pain.  Skin: Negative for rash, abrasions, lacerations, ecchymosis. Neurological: Negative for headaches, focal weakness or numbness. ___________________________________________   PHYSICAL EXAM:  VITAL SIGNS: ED Triage Vitals  Enc Vitals Group     BP 05/24/16 1521 (!) 158/62     Pulse Rate 05/24/16 1521 (!) 58     Resp 05/24/16 1521 20     Temp 05/24/16 1521 98.1 F (36.7 C)     Temp Source 05/24/16 1521 Oral     SpO2 05/24/16 1521 99 %     Weight 05/24/16 1522 172 lb (78 kg)     Height 05/24/16 1522 4\' 11"  (1.499 m)     Head Circumference --      Peak Flow --      Pain Score 05/24/16 1522 9     Pain  Loc --      Pain Edu? --      Excl. in GC? --    Constitutional: Alert and oriented. Patient is talkative. She does not make eye contact. Eyes: Palpebral and bulbar conjunctiva are nonerythematous bilaterally. PERRL. EOMI.  Head: Atraumatic. Cardiovascular: No pain with palpation over the anterior and posterior chest wall. No murmurs, gallops or rubs auscultated.  Respiratory: Trachea is midline. No retractions or presence of deformity. On auscultation, adventitious sounds are absent.  Musculoskeletal: Patient has 5/5 strength in the upper and lower extremities  bilaterally. Full range of motion at the shoulder, elbow and wrist bilaterally. Full range of motion at the hip, knee and ankle bilaterally. No changes in gait. Patient is able to perform full range of motion at the neck. No radiculopathy was elicited with range of motion at the neck. No tenderness is elicited with palpation along the cervical spine. Patient has tenderness to palpation along the upper trapezius bilaterally. Palpable radial, ulnar and dorsalis pedis pulses bilaterally and symmetrically. Neurologic: Normal speech and language. No gross focal neurologic deficits are appreciated. Cranial nerves: 2-10 normal as tested. Sensorimotor: No sensory loss or abnormal reflexes. Speech: No dysarthria or expressive aphasia.  Skin:  Skin is warm, dry and intact. No rash or bruising noted.  Psychiatric: Mood and affect are normal for age. Speech and behavior are normal.  ___________________________________________   LABS (all labs ordered are listed, but only abnormal results are displayed)  Labs Reviewed - No data to display ____________________________________________  EKG   ____________________________________________  RADIOLOGY   No results found.  ____________________________________________    PROCEDURES  Procedure(s) performed:    Procedures    Medications - No data to display   ____________________________________________   INITIAL IMPRESSION / ASSESSMENT AND PLAN / ED COURSE  Pertinent labs & imaging results that were available during my care of the patient were reviewed by me and considered in my medical decision making (see chart for details).  Review of the [place] CSRS was performed in accordance of the [facility] prior to dispensing any controlled drugs.     Assessment and Plan: Bilateral upper trapezius pain.  Patient presents to the emergency department with bilateral upper trapezius pain for the past 2 days. On physical exam, tenderness was elicited with  palpation of the upper trapezius. Patient denies new falls or traumas. Patient was discharged with a short course of Flexeril. A referral was made to orthopedics, Dr. Rosita Kea. Patient was advised to make an appointment. All patient questions were answered.  ____________________________________________  FINAL CLINICAL IMPRESSION(S) / ED DIAGNOSES  Final diagnoses:  Trapezius muscle spasm      NEW MEDICATIONS STARTED DURING THIS VISIT:  New Prescriptions   CYCLOBENZAPRINE (FLEXERIL) 5 MG TABLET    Take 1 tablet (5 mg total) by mouth 3 (three) times daily as needed for muscle spasms.        This chart was dictated using voice recognition software/Dragon. Despite best efforts to proofread, errors can occur which can change the meaning. Any change was purely unintentional.    Orvil Feil, PA-C 05/24/16 1741    Jennye Moccasin, MD 05/24/16 [phone]

## 2016-05-24 NOTE — ED Notes (Signed)
Pt c/o neck pain since Friday. Pt is able to touch her chin to her chest, pt c/o pain with side to side movement. Denies numbness/tingling at this time.

## 2016-05-24 NOTE — ED Notes (Signed)
NAD noted at time of D/C. Pt denies questions or concerns. Pt ambulatory to the lobby at this time.  

## 2016-06-08 ENCOUNTER — Emergency Department: Payer: Medicare Other

## 2016-06-08 ENCOUNTER — Emergency Department
Admission: EM | Admit: 2016-06-08 | Discharge: 2016-06-08 | Disposition: A | Discharge status: Home / Self Care | Payer: Medicare Other | Attending: Emergency Medicine | Admitting: Emergency Medicine

## 2016-06-08 ENCOUNTER — Encounter: Payer: Self-pay | Admitting: Emergency Medicine

## 2016-06-08 DIAGNOSIS — I10 Essential (primary) hypertension: Secondary | ICD-10-CM | POA: Insufficient documentation

## 2016-06-08 DIAGNOSIS — M10031 Idiopathic gout, right wrist: Secondary | ICD-10-CM | POA: Insufficient documentation

## 2016-06-08 DIAGNOSIS — J45909 Unspecified asthma, uncomplicated: Secondary | ICD-10-CM | POA: Diagnosis not present

## 2016-06-08 DIAGNOSIS — M25531 Pain in right wrist: Secondary | ICD-10-CM | POA: Diagnosis present

## 2016-06-08 DIAGNOSIS — E119 Type 2 diabetes mellitus without complications: Secondary | ICD-10-CM | POA: Insufficient documentation

## 2016-06-08 DIAGNOSIS — M109 Gout, unspecified: Secondary | ICD-10-CM

## 2016-06-08 HISTORY — DX: Unspecified osteoarthritis, unspecified site: M19.90

## 2016-06-08 MED ORDER — HYDROCODONE-ACETAMINOPHEN 5-325 MG PO TABS: 1.0000 | ORAL_TABLET | ORAL | 0 refills | Status: DC | PRN

## 2016-06-08 MED ORDER — COLCHICINE 0.6 MG PO TABS: 0.6000 mg | ORAL_TABLET | Freq: Two times a day (BID) | ORAL | 2 refills | Status: DC

## 2016-06-08 NOTE — ED Provider Notes (Signed)
[facility] Emergency Department Provider Note   ____________________________________________   First MD Initiated Contact with Patient 06/08/16 1658     (approximate)  I have reviewed the triage vital signs and the nursing notes.   HISTORY  Chief Complaint Wrist Pain (swelling) and Hand Pain (swelling)    HPI Shari Malone is a 66 y.o. female is here complaining of right wrist and hand pain. Patient states that she began having some swelling and pain over the weekend. Patient has a history of gout. She states that she is not currently taking any gout medication. She has been on colchicine in the past as well as Indocin and indocin. She states that her primary care doctor is in [place] and that he only gives her 10 colchicine tablets at a time. Patient denies any injury to her hand or recent falls. She denies fever or chills. She rates her pain as a 5 out of 10.   Past Medical History:  Diagnosis Date  . Arthritis   . Asthma   . Diabetes mellitus without complication (HCC)   . Glaucoma   . Gout   . Hypertension   . Renal disorder   . Thyroid disease     There are no active problems to display for this patient.   Past Surgical History:  Procedure Laterality Date  . CHOLECYSTECTOMY    . MINOR CARPAL TUNNEL    . TUBAL LIGATION      Prior to Admission medications   Medication Sig Start Date End Date Taking? Authorizing Provider  acetaminophen (TYLENOL) 325 MG tablet Take 1 tablet (325 mg total) by mouth every 4 (four) hours as needed. 03/13/16 03/13/17  Orvil Feil, PA-C  colchicine 0.6 MG tablet Take 1 tablet (0.6 mg total) by mouth 2 (two) times daily. 06/08/16 06/08/17  Tommi Rumps, PA-C  HYDROcodone-acetaminophen (NORCO) 5-325 MG tablet Take 1 tablet by mouth every 4 (four) hours as needed for moderate pain. 06/08/16   Tommi Rumps, PA-C    Allergies Amoxicillin and Penicillins  No family history on file.  Social  History Social History  Substance Use Topics  . Smoking status: Never Smoker  . Smokeless tobacco: Never Used  . Alcohol use No    Review of Systems Constitutional: No fever/chills Cardiovascular: Denies chest pain. Respiratory: Denies shortness of breath. Gastrointestinal: No abdominal pain.  No nausea, no vomiting.   Musculoskeletal: Positive for right hand pain. Skin: Negative for rash. Neurological: Negative for headaches, focal weakness or numbness.  10-point ROS otherwise negative.  ____________________________________________   PHYSICAL EXAM:  VITAL SIGNS: ED Triage Vitals  Enc Vitals Group     BP 06/08/16 1618 (!) 167/65     Pulse Rate 06/08/16 1618 66     Resp 06/08/16 1618 16     Temp 06/08/16 1618 98.4 F (36.9 C)     Temp Source 06/08/16 1618 Oral     SpO2 06/08/16 1618 98 %     Weight 06/08/16 1619 172 lb (78 kg)     Height 06/08/16 1619 4\' 11"  (1.499 m)     Head Circumference --      Peak Flow --      Pain Score 06/08/16 1630 5     Pain Loc --      Pain Edu? --      Excl. in GC? --     Constitutional: Alert and oriented. Well appearing and in no acute distress. Obesity Eyes: Conjunctivae are  normal. PERRL. EOMI. Head: Atraumatic. Nose: No congestion/rhinnorhea. Neck: No stridor.   Cardiovascular: Normal rate, regular rhythm. Grossly normal heart sounds.  Good peripheral circulation. Respiratory: Normal respiratory effort.  No retractions. Lungs CTAB. Gastrointestinal: Soft and nontender. No distention.  Musculoskeletal: Examination of the right hand there is chronic changes present in the PIP joints of all digits. 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 There is moderate soft tissue swelling present dorsum of the right hand. There is no erythema present. There  is no abrasions or ecchymosis to suggest injury. Patient is unable to make a fist due to soft tissue swelling and chronic condition of her digits. Neurologic:   Normal speech and language. No gross focal neurologic deficits are appreciated. No gait instability. Skin:  Skin is warm, dry and intact. No rash noted. Psychiatric: Mood and affect are normal. Speech and behavior are normal.  ____________________________________________   LABS (all labs ordered are listed, but only abnormal results are displayed)  Labs Reviewed - No data to display    RADIOLOGY  Right hand x-ray per radiologist: IMPRESSION: 1. Scattered arthropathy of the right hand as described above. 2. 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 ____________________________________________   PROCEDURES  Procedure(s) performed: None  Procedures  Critical Care performed: No  ____________________________________________   INITIAL IMPRESSION / ASSESSMENT AND PLAN / ED COURSE  Pertinent labs & imaging results that were available during my care of the patient were reviewed by me and considered in my medical decision making (see chart for details).  Review of her records from [facility] and appears that she has had gouty arthritis of her right and left hand in the past. There has been moderate bone destruction on x-ray mentioned on previous x-rays. Patient currently is not taking any medication for her gout. She is given a prescription for colchicine 0.6mg   twice a day and Norco as needed for severe pain. She is to make an appointment with her primary care doctor in [facility] for any continued pain management and also for ongoing prescriptions for allupurinol.       ____________________________________________   FINAL CLINICAL IMPRESSION(S) / ED DIAGNOSES  Final diagnoses:  Acute gout of right hand, unspecified cause       NEW MEDICATIONS STARTED DURING THIS VISIT:  New Prescriptions   COLCHICINE 0.6 MG TABLET    Take 1 tablet (0.6 mg total) by mouth 2 (two) times daily.     Note:  This document was prepared using Dragon voice recognition software and may include unintentional dictation errors.    Tommi Rumps, PA-C 06/08/16 1854    Loleta Rose, MD 06/08/16 [phone]

## 2016-06-08 NOTE — ED Triage Notes (Signed)
C/O right wrist pain and swelling.  Also swelling to hand.  Denies injury.

## 2016-06-08 NOTE — Discharge Instructions (Signed)
Follow up with your doctor in [place] who is in continued problems. Call and make an appointment. Begin taking colchicine twice a day with food. Norco as needed for severe pain 1 every 4 hours if needed. This medication contains a narcotic and could cause drowsiness which might increase your  risk for falling.

## 2016-07-12 ENCOUNTER — Emergency Department
Admission: EM | Admit: 2016-07-12 | Discharge: 2016-07-12 | Disposition: A | Discharge status: Home / Self Care | Payer: Medicare Other | Attending: Emergency Medicine | Admitting: Emergency Medicine

## 2016-07-12 ENCOUNTER — Encounter: Payer: Self-pay | Admitting: Emergency Medicine

## 2016-07-12 DIAGNOSIS — Z79899 Other long term (current) drug therapy: Secondary | ICD-10-CM | POA: Insufficient documentation

## 2016-07-12 DIAGNOSIS — I10 Essential (primary) hypertension: Secondary | ICD-10-CM | POA: Diagnosis not present

## 2016-07-12 DIAGNOSIS — N390 Urinary tract infection, site not specified: Secondary | ICD-10-CM | POA: Insufficient documentation

## 2016-07-12 DIAGNOSIS — E119 Type 2 diabetes mellitus without complications: Secondary | ICD-10-CM | POA: Diagnosis not present

## 2016-07-12 DIAGNOSIS — M545 Low back pain: Secondary | ICD-10-CM | POA: Diagnosis present

## 2016-07-12 DIAGNOSIS — J45909 Unspecified asthma, uncomplicated: Secondary | ICD-10-CM | POA: Insufficient documentation

## 2016-07-12 LAB — URINALYSIS, COMPLETE (UACMP) WITH MICROSCOPIC
BILIRUBIN URINE: NEGATIVE
Bacteria, UA: NONE SEEN
Glucose, UA: NEGATIVE mg/dL
HGB URINE DIPSTICK: NEGATIVE
Ketones, ur: NEGATIVE mg/dL
NITRITE: NEGATIVE
Protein, ur: 30 mg/dL — AB
SPECIFIC GRAVITY, URINE: 1.018 (ref 1.005–1.030)
pH: 5 (ref 5.0–8.0)

## 2016-07-12 MED ORDER — SULFAMETHOXAZOLE-TRIMETHOPRIM 800-160 MG PO TABS: 1.0000 | ORAL_TABLET | Freq: Two times a day (BID) | ORAL | 0 refills | Status: DC

## 2016-07-12 MED ORDER — SULFAMETHOXAZOLE-TRIMETHOPRIM 800-160 MG PO TABS
1.0000 | ORAL_TABLET | Freq: Once | ORAL | Status: AC
  Administered 2016-07-12: 1 via ORAL
  Filled 2016-07-12: qty 1

## 2016-07-12 NOTE — ED Triage Notes (Signed)
States upper/mid back pain x 4 days with no known injury

## 2016-07-12 NOTE — ED Notes (Signed)

## 2016-07-12 NOTE — ED Provider Notes (Signed)
[facility] Emergency Department Provider Note   ____________________________________________   First MD Initiated Contact with Patient 07/12/16 1706     (approximate)  I have reviewed the triage vital signs and the nursing notes.   HISTORY  Chief Complaint Back Pain    HPI Shari Malone is a 66 y.o. female for complaint of right-sided back pain for approximately 4 days. Patient denies any history of injury. Patient is somewhat been taking any over-the-counter medication for her pain. She states that she has been told in the past she had a urinary tract infection however when she followed up with the urologist he is told her that her kidneys were "small". She is unaware of any history of kidney stones. Patient denies any radiation of her back pain. She denies any previous back pain history. She rates her pain as 5/10.   Past Medical History:  Diagnosis Date  . Arthritis   . Asthma   . Diabetes mellitus without complication (HCC)   . Glaucoma   . Gout   . Hypertension   . Renal disorder   . Thyroid disease     There are no active problems to display for this patient.   Past Surgical History:  Procedure Laterality Date  . CHOLECYSTECTOMY    . MINOR CARPAL TUNNEL    . TUBAL LIGATION      Prior to Admission medications   Medication Sig Start Date End Date Taking? Authorizing Provider  acetaminophen (TYLENOL) 325 MG tablet Take 1 tablet (325 mg total) by mouth every 4 (four) hours as needed. 03/13/16 03/13/17  Orvil Feil, PA-C  colchicine 0.6 MG tablet Take 1 tablet (0.6 mg total) by mouth 2 (two) times daily. 06/08/16 06/08/17  Tommi Rumps, PA-C  sulfamethoxazole-trimethoprim (BACTRIM DS,SEPTRA DS) 800-160 MG tablet Take 1 tablet by mouth 2 (two) times daily. 07/12/16   Tommi Rumps, PA-C    Allergies Amoxicillin and Penicillins  History reviewed. No pertinent family history.  Social History Social History  Substance Use Topics   . Smoking status: Never Smoker  . Smokeless tobacco: Never Used  . Alcohol use No    Review of Systems Constitutional: No fever/chills Cardiovascular: Denies chest pain. Respiratory: Denies shortness of breath. Gastrointestinal: No abdominal pain.  No nausea, no vomiting.  No diarrhea.   Genitourinary: Negative for dysuria. Musculoskeletal: Positive for gouty arthritis. Skin: Negative for rash. Neurological: Negative for headaches, focal weakness or numbness.  10-point ROS otherwise negative.  ____________________________________________   PHYSICAL EXAM:  VITAL SIGNS: ED Triage Vitals  Enc Vitals Group     BP 07/12/16 1642 (!) 166/57     Pulse Rate 07/12/16 1642 69     Resp 07/12/16 1642 18     Temp 07/12/16 1642 98.2 F (36.8 C)     Temp src --      SpO2 07/12/16 1642 98 %     Weight 07/12/16 1642 168 lb (76.2 kg)     Height 07/12/16 1642 4\' 11"  (1.499 m)     Head Circumference --      Peak Flow --      Pain Score 07/12/16 1641 5     Pain Loc --      Pain Edu? --      Excl. in GC? --     Constitutional: Alert and oriented. Well appearing and in no acute distress. Eyes: Conjunctivae are normal. PERRL. EOMI. Head: Atraumatic. Nose: No congestion/rhinnorhea. Neck: No stridor.   Cardiovascular:  Normal rate, regular rhythm. Grossly normal heart sounds.  Good peripheral circulation. Respiratory: Normal respiratory effort.  No retractions. Lungs CTAB. Musculoskeletal: Examination of the back there is no gross deformity or active muscle spasm seen. There is some soft tissue tenderness on palpation of the right  lower back.  No difficulty with range of motion. No CVA tenderness is noted. Neurologic:  Normal speech and language. No gross focal neurologic deficits are appreciated. No gait instability. Skin:  Skin is warm, dry and intact. No rash noted. Psychiatric: Mood and affect are normal. Speech and behavior are normal.  ____________________________________________    LABS (all labs ordered are listed, but only abnormal results are displayed)  Labs Reviewed  URINALYSIS, COMPLETE (UACMP) WITH MICROSCOPIC - Abnormal; Notable for the following:       Result Value   Color, Urine YELLOW (*)    APPearance HAZY (*)    Protein, ur 30 (*)    Leukocytes, UA MODERATE (*)    Squamous Epithelial / LPF 0-5 (*)    All other components within normal limits    PROCEDURES  Procedure(s) performed: None  Procedures  Critical Care performed: No  ____________________________________________   INITIAL IMPRESSION / ASSESSMENT AND PLAN / ED COURSE  Pertinent labs & imaging results that were available during my care of the patient were reviewed by me and considered in my medical decision making (see chart for details).   Patient urinalysis was positive for UTI. Patient denies any nausea, vomiting or fever or chills. Patient was given Bactrim DS twice a day for 10 days and instructed take Tylenol or ibuprofen as needed for back pain. She is to increase fluids. She was also given her first dose in the emergency room as it is doubtful that there are any pharmacies open at this time. She is encouraged to call her PCP at [facility] for recheck of her urine after finishing the antibiotic.     ____________________________________________   FINAL CLINICAL IMPRESSION(S) / ED DIAGNOSES  Final diagnoses:  Acute urinary tract infection      NEW MEDICATIONS STARTED DURING THIS VISIT:  New Prescriptions   SULFAMETHOXAZOLE-TRIMETHOPRIM (BACTRIM DS,SEPTRA DS) 800-160 MG TABLET    Take 1 tablet by mouth 2 (two) times daily.     Note:  This document was prepared using Dragon voice recognition software and may include unintentional dictation errors.    Tommi Rumps, PA-C 07/12/16 1911    Minna Antis, MD 07/12/16 2012

## 2016-07-12 NOTE — Discharge Instructions (Signed)
Increase fluids. Begin taking antibiotic twice a day every day for the next 10 days. Your first dose was given to you in the emergency room tonight. Call and make an appointment with your primary care doctor at [facility] for recheck of urine in 12-14 days. He also take Tylenol or ibuprofen as needed for back pain. This will continue to improve as your infection resolves.

## 2016-10-02 DIAGNOSIS — Z79899 Other long term (current) drug therapy: Secondary | ICD-10-CM | POA: Insufficient documentation

## 2017-02-17 ENCOUNTER — Emergency Department
Admission: EM | Admit: 2017-02-17 | Discharge: 2017-02-17 | Disposition: A | Discharge status: Home / Self Care | Payer: Medicare Other | Attending: Emergency Medicine | Admitting: Emergency Medicine

## 2017-02-17 ENCOUNTER — Other Ambulatory Visit: Payer: Self-pay

## 2017-02-17 ENCOUNTER — Encounter: Payer: Self-pay | Admitting: Emergency Medicine

## 2017-02-17 DIAGNOSIS — J45909 Unspecified asthma, uncomplicated: Secondary | ICD-10-CM | POA: Insufficient documentation

## 2017-02-17 DIAGNOSIS — M62838 Other muscle spasm: Secondary | ICD-10-CM | POA: Diagnosis present

## 2017-02-17 DIAGNOSIS — E119 Type 2 diabetes mellitus without complications: Secondary | ICD-10-CM | POA: Insufficient documentation

## 2017-02-17 DIAGNOSIS — I10 Essential (primary) hypertension: Secondary | ICD-10-CM | POA: Diagnosis not present

## 2017-02-17 MED ORDER — CYCLOBENZAPRINE HCL 10 MG PO TABS: 10.0000 mg | ORAL_TABLET | Freq: Three times a day (TID) | ORAL | 0 refills | Status: DC | PRN

## 2017-02-17 MED ORDER — TRAMADOL HCL 50 MG PO TABS: 50.0000 mg | ORAL_TABLET | Freq: Four times a day (QID) | ORAL | 0 refills | Status: AC | PRN

## 2017-02-17 NOTE — ED Triage Notes (Signed)
Right mid back spasms for a few days now, has had this before. NAD.

## 2017-02-17 NOTE — ED Provider Notes (Signed)
[facility] Emergency Department Provider Note   ____________________________________________   First MD Initiated Contact with Patient 02/17/17 [phone]     (approximate)  I have reviewed the triage vital signs and the nursing notes.   HISTORY  Chief Complaint Spasms    HPI Shari Malone is a 66 y.o. female patient complaining of mid back muscle spasms for a few days. Patient has been seen for this complaint in the past. Patient states she received moderate relief with left lower muscle relaxants she had from a previous visit.Patient rates her pain discomfort as a 4/10. Patient describes the pain as "spasmatic".   Past Medical History:  Diagnosis Date  . Arthritis   . Asthma   . Diabetes mellitus without complication (HCC)   . Glaucoma   . Gout   . Hypertension   . Renal disorder   . Thyroid disease     There are no active problems to display for this patient.   Past Surgical History:  Procedure Laterality Date  . CHOLECYSTECTOMY    . MINOR CARPAL TUNNEL    . TUBAL LIGATION      Prior to Admission medications   Medication Sig Start Date End Date Taking? Authorizing Provider  acetaminophen (TYLENOL) 325 MG tablet Take 1 tablet (325 mg total) by mouth every 4 (four) hours as needed. 03/13/16 03/13/17  Orvil Feil, PA-C  colchicine 0.6 MG tablet Take 1 tablet (0.6 mg total) by mouth 2 (two) times daily. 06/08/16 06/08/17  Tommi Rumps, PA-C  cyclobenzaprine (FLEXERIL) 10 MG tablet Take 1 tablet (10 mg total) 3 (three) times daily as needed by mouth. 02/17/17   Joni Reining, PA-C  sulfamethoxazole-trimethoprim (BACTRIM DS,SEPTRA DS) 800-160 MG tablet Take 1 tablet by mouth 2 (two) times daily. 07/12/16   Tommi Rumps, PA-C  traMADol (ULTRAM) 50 MG tablet Take 1 tablet (50 mg total) every 6 (six) hours as needed by mouth. 02/17/17 02/17/18  Joni Reining, PA-C    Allergies Amoxicillin and Penicillins  No family history on  file.  Social History Social History   Tobacco Use  . Smoking status: Never Smoker  . Smokeless tobacco: Never Used  Substance Use Topics  . Alcohol use: No  . Drug use: No    Review of Systems Constitutional: No fever/chills Eyes: No visual changes. ENT: No sore throat. Cardiovascular: Denies chest pain. Respiratory: Denies shortness of breath. Gastrointestinal: No abdominal pain.  No nausea, no vomiting.  No diarrhea.  No constipation. Genitourinary: Negative for dysuria. Musculoskeletal: Negative for back pain. Skin: Negative for rash. Neurological: Negative for headaches, focal weakness or numbness. Endocrine:Diabetes, hypertension, and hypothyroidism. Allergic/Immunilogical: Penicillin ____________________________________________   PHYSICAL EXAM:  VITAL SIGNS: ED Triage Vitals  Enc Vitals Group     BP 02/17/17 0921 (!) 150/62     Pulse Rate 02/17/17 0921 62     Resp 02/17/17 0921 18     Temp 02/17/17 0921 98.5 F (36.9 C)     Temp Source 02/17/17 0921 Oral     SpO2 02/17/17 0921 100 %     Weight 02/17/17 0921 174 lb (78.9 kg)     Height 02/17/17 0921 4\' 11"  (1.499 m)     Head Circumference --      Peak Flow --      Pain Score 02/17/17 0924 4     Pain Loc --      Pain Edu? --      Excl. in GC? --  Constitutional: Alert and oriented. Well appearing and in no acute distress. Neck: No stridor.  No cervical spine tenderness to palpation. Cardiovascular: Normal rate, regular rhythm. Grossly normal heart sounds.  Good peripheral circulation. Respiratory: Normal respiratory effort.  No retractions. Lungs CTAB. Musculoskeletal: Patient had full  range of motion of cervical spine. Patient full l range of motion of the upper extremities. Patient has moderate guarding palpation right trapezius muscle area. No lower extremity tenderness nor edema.  No joint effusions. Neurologic:  Normal speech and language. No gross focal neurologic deficits are appreciated. No gait  instability. Skin:  Skin is warm, dry and intact. No rash noted. Psychiatric: Mood and affect are normal. Speech and behavior are normal.  ____________________________________________   LABS (all labs ordered are listed, but only abnormal results are displayed)  Labs Reviewed - No data to display ____________________________________________  EKG   ____________________________________________  RADIOLOGY  No results found.  ____________________________________________   PROCEDURES  Procedure(s) performed: None  Procedures  Critical Care performed: No  ____________________________________________   INITIAL IMPRESSION / ASSESSMENT AND PLAN / ED COURSE  As part of my medical decision making, I reviewed the following data within the electronic MEDICAL RECORD NUMBER    Pain secondary to right trapezius muscle strain. Patient given discharge Instruction. Patient denies follow PCP for continual care.      ____________________________________________   FINAL CLINICAL IMPRESSION(S) / ED DIAGNOSES  Final diagnoses:  Trapezius muscle spasm     ED Discharge Orders        Ordered    cyclobenzaprine (FLEXERIL) 10 MG tablet  3 times daily PRN     02/17/17 1000    traMADol (ULTRAM) 50 MG tablet  Every 6 hours PRN     02/17/17 1000       Note:  This document was prepared using Dragon voice recognition software and may include unintentional dictation errors.    Joni Reining, PA-C 02/17/17 1005    Emily Filbert, MD 02/17/17 1017

## 2017-04-01 ENCOUNTER — Emergency Department
Admission: EM | Admit: 2017-04-01 | Discharge: 2017-04-01 | Disposition: A | Discharge status: Home / Self Care | Payer: Medicare Other | Attending: Emergency Medicine | Admitting: Emergency Medicine

## 2017-04-01 ENCOUNTER — Other Ambulatory Visit: Payer: Self-pay

## 2017-04-01 DIAGNOSIS — M62838 Other muscle spasm: Secondary | ICD-10-CM

## 2017-04-01 DIAGNOSIS — Z7982 Long term (current) use of aspirin: Secondary | ICD-10-CM | POA: Insufficient documentation

## 2017-04-01 DIAGNOSIS — I10 Essential (primary) hypertension: Secondary | ICD-10-CM | POA: Diagnosis not present

## 2017-04-01 DIAGNOSIS — Z7984 Long term (current) use of oral hypoglycemic drugs: Secondary | ICD-10-CM | POA: Insufficient documentation

## 2017-04-01 DIAGNOSIS — E119 Type 2 diabetes mellitus without complications: Secondary | ICD-10-CM | POA: Insufficient documentation

## 2017-04-01 DIAGNOSIS — Z79899 Other long term (current) drug therapy: Secondary | ICD-10-CM | POA: Insufficient documentation

## 2017-04-01 DIAGNOSIS — M62830 Muscle spasm of back: Secondary | ICD-10-CM | POA: Insufficient documentation

## 2017-04-01 DIAGNOSIS — J45909 Unspecified asthma, uncomplicated: Secondary | ICD-10-CM | POA: Insufficient documentation

## 2017-04-01 MED ORDER — CYCLOBENZAPRINE HCL 10 MG PO TABS: 10.0000 mg | ORAL_TABLET | Freq: Three times a day (TID) | ORAL | 0 refills | Status: AC | PRN

## 2017-04-01 MED ORDER — CYCLOBENZAPRINE HCL 10 MG PO TABS
5.0000 mg | ORAL_TABLET | Freq: Once | ORAL | Status: AC
  Administered 2017-04-01: 5 mg via ORAL
  Filled 2017-04-01: qty 1

## 2017-04-01 NOTE — ED Triage Notes (Signed)
Pt alert, oriented, ambulatory. Pt states muscle spasms that began yesterday in lower back and R side. Denies doing anything out of the ordinary when spasms started.

## 2017-04-01 NOTE — ED Notes (Signed)
See triage note  Presents with spasms to right mid/lower back which radiates under breast area  States sx's started yesterday  Denies any trauma or fever

## 2017-04-01 NOTE — ED Provider Notes (Signed)
[facility] Emergency Department Provider Note  ____________________________________________  Time seen: Approximately 4:15 PM  I have reviewed the triage vital signs and the nursing notes.   HISTORY  Chief Complaint Back Pain (spasms )    HPI Shari Malone is a 66 y.o. female presents to the emergency department with right sided trapezius pain.  Patient reports a history of muscle spasms in the past, which has been alleviated with Flexeril.  Patient denies chest pain, chest tightness, nausea or vomiting.  She denies falls or mechanisms of trauma.  No weakness or radiculopathy in the upper or lower extremities.  Patient has noticed no changes in sensation.  Patient reports having a driver to the emergency department.   Past Medical History:  Diagnosis Date  . Arthritis   . Asthma   . Diabetes mellitus without complication (HCC)   . Glaucoma   . Gout   . Hypertension   . Renal disorder   . Thyroid disease     There are no active problems to display for this patient.   Past Surgical History:  Procedure Laterality Date  . CHOLECYSTECTOMY    . MINOR CARPAL TUNNEL    . TUBAL LIGATION      Prior to Admission medications   Medication Sig Start Date End Date Taking? Authorizing Provider  aspirin 81 MG chewable tablet Chew 81 mg by mouth daily.   Yes [provider]  atorvastatin (LIPITOR) 20 MG tablet Take 20 mg by mouth daily.   Yes [provider]  carvedilol (COREG) 6.25 MG tablet Take 6.25 mg by mouth 2 (two) times daily with a meal.   Yes [provider]  chlorthalidone (HYGROTON) 25 MG tablet Take 25 mg by mouth daily.   Yes [provider]  glipiZIDE (GLUCOTROL) 10 MG tablet Take 10 mg by mouth daily before breakfast.   Yes [provider]  levothyroxine (SYNTHROID, LEVOTHROID) 112 MCG tablet Take 112 mcg by mouth daily before breakfast.   Yes [provider]  colchicine 0.6 MG tablet Take 1  tablet (0.6 mg total) by mouth 2 (two) times daily. 06/08/16 06/08/17  Tommi Rumps, PA-C  cyclobenzaprine (FLEXERIL) 10 MG tablet Take 1 tablet (10 mg total) 3 (three) times daily as needed by mouth. 02/17/17   Joni Reining, PA-C  traMADol (ULTRAM) 50 MG tablet Take 1 tablet (50 mg total) every 6 (six) hours as needed by mouth. 02/17/17 02/17/18  Joni Reining, PA-C    Allergies Amoxicillin and Penicillins  History reviewed. No pertinent family history.  Social History Social History   Tobacco Use  . Smoking status: Never Smoker  . Smokeless tobacco: Never Used  Substance Use Topics  . Alcohol use: No  . Drug use: No     Review of Systems  Constitutional: No fever/chills Eyes: No visual changes. No discharge ENT: No upper respiratory complaints. Cardiovascular: no chest pain. Respiratory: no cough. No SOB. Musculoskeletal: Patient has right upper back muscle spasms. Skin: Negative for rash, abrasions, lacerations, ecchymosis. Neurological: Negative for headaches, focal weakness or numbness.  ____________________________________________   PHYSICAL EXAM:  VITAL SIGNS: ED Triage Vitals [04/01/17 1545]  Enc Vitals Group     BP (!) 158/61     Pulse Rate (!) 46     Resp 16     Temp (!) 97.4 F (36.3 C)     Temp Source Oral     SpO2 100 %     Weight 173 lb (78.5  kg)     Height 4\' 11"  (1.499 m)     Head Circumference      Peak Flow      Pain Score 4     Pain Loc      Pain Edu?      Excl. in GC?      Constitutional: Alert and oriented. Well appearing and in no acute distress. Eyes: Conjunctivae are normal. PERRL. EOMI. Head: Atraumatic. Cardiovascular: Normal rate, regular rhythm. Normal S1 and S2.  Good peripheral circulation. Respiratory: Normal respiratory effort without tachypnea or retractions. Lungs CTAB. Good air entry to the bases with no decreased or absent breath sounds. Musculoskeletal: Full range of motion to all extremities. No gross  deformities appreciated.  Patient has tenderness elicited with palpation over the right trapezius. Neurologic:  Normal speech and language. No gross focal neurologic deficits are appreciated.  Skin:  Skin is warm, dry and intact. No rash noted. Psychiatric: Mood and affect are normal. Speech and behavior are normal. Patient exhibits appropriate insight and judgement.   ____________________________________________   LABS (all labs ordered are listed, but only abnormal results are displayed)  Labs Reviewed - No data to display ____________________________________________  EKG   ____________________________________________  RADIOLOGY  No results found.  ____________________________________________    PROCEDURES  Procedure(s) performed:    Procedures    Medications  cyclobenzaprine (FLEXERIL) tablet 5 mg (not administered)     ____________________________________________   INITIAL IMPRESSION / ASSESSMENT AND PLAN / ED COURSE  Pertinent labs & imaging results that were available during my care of the patient were reviewed by me and considered in my medical decision making (see chart for details).  Review of the [place] CSRS was performed in accordance of the [facility] prior to dispensing any controlled drugs.     Assessment and plan Muscle spasm Patient presents to the emergency department with tenderness along the right upper trapezius.  Patient was given Flexeril in the emergency department.  She was discharged with Flexeril.  Patient was advised to follow-up with primary care as needed.  All patient questions were answered.   ____________________________________________  FINAL CLINICAL IMPRESSION(S) / ED DIAGNOSES  Final diagnoses:  Muscle spasm      NEW MEDICATIONS STARTED DURING THIS VISIT:  ED Discharge Orders    None          This chart was dictated using voice recognition software/Dragon. Despite best efforts to proofread, errors can occur which  can change the meaning. Any change was purely unintentional.    , PA-C 04/01/17 1623    04/03/17, MD 04/01/17 [phone]

## 2017-12-03 ENCOUNTER — Encounter: Payer: Self-pay | Admitting: Emergency Medicine

## 2017-12-03 ENCOUNTER — Emergency Department
Admission: EM | Admit: 2017-12-03 | Discharge: 2017-12-03 | Disposition: A | Discharge status: Home / Self Care | Payer: Medicare Other | Attending: Emergency Medicine | Admitting: Emergency Medicine

## 2017-12-03 DIAGNOSIS — S3992XA Unspecified injury of lower back, initial encounter: Secondary | ICD-10-CM | POA: Diagnosis present

## 2017-12-03 DIAGNOSIS — X58XXXA Exposure to other specified factors, initial encounter: Secondary | ICD-10-CM | POA: Diagnosis not present

## 2017-12-03 DIAGNOSIS — I10 Essential (primary) hypertension: Secondary | ICD-10-CM | POA: Insufficient documentation

## 2017-12-03 DIAGNOSIS — E119 Type 2 diabetes mellitus without complications: Secondary | ICD-10-CM | POA: Diagnosis not present

## 2017-12-03 DIAGNOSIS — S39012A Strain of muscle, fascia and tendon of lower back, initial encounter: Secondary | ICD-10-CM | POA: Diagnosis not present

## 2017-12-03 DIAGNOSIS — Y939 Activity, unspecified: Secondary | ICD-10-CM | POA: Diagnosis not present

## 2017-12-03 DIAGNOSIS — Z79899 Other long term (current) drug therapy: Secondary | ICD-10-CM | POA: Diagnosis not present

## 2017-12-03 DIAGNOSIS — Y929 Unspecified place or not applicable: Secondary | ICD-10-CM | POA: Diagnosis not present

## 2017-12-03 DIAGNOSIS — Z7984 Long term (current) use of oral hypoglycemic drugs: Secondary | ICD-10-CM | POA: Insufficient documentation

## 2017-12-03 DIAGNOSIS — J45909 Unspecified asthma, uncomplicated: Secondary | ICD-10-CM | POA: Insufficient documentation

## 2017-12-03 DIAGNOSIS — Y999 Unspecified external cause status: Secondary | ICD-10-CM | POA: Insufficient documentation

## 2017-12-03 DIAGNOSIS — Z7982 Long term (current) use of aspirin: Secondary | ICD-10-CM | POA: Diagnosis not present

## 2017-12-03 MED ORDER — CYCLOBENZAPRINE HCL 10 MG PO TABS: 10.0000 mg | ORAL_TABLET | Freq: Three times a day (TID) | ORAL | 1 refills | Status: DC | PRN

## 2017-12-03 NOTE — ED Triage Notes (Signed)
Pt reports muscle spasms to back since yesterday. Pt reports hx of the same. Denies re injury, SOB, CP or other sx's.

## 2017-12-03 NOTE — ED Notes (Signed)
See triage note  Presents with spasms like pain to mid back on the right  States pain started yesterday

## 2017-12-03 NOTE — ED Provider Notes (Signed)
[facility] Emergency Department Provider Note   ____________________________________________   First MD Initiated Contact with Patient 12/03/17 1258     (approximate)  I have reviewed the triage vital signs and the nursing notes.   HISTORY  Chief Complaint Back Pain and Spasms    HPI Shari Malone is a 67 y.o. female patient presents with muscle spasm right mid back which started yesterday.  Patient recurrent history of this complaint.  Patient states she received moderate relief using Flexeril.  Patient denies radicular component to her back pain.  Patient has bladder bowel dysfunction.  Patient rates the pain as a 4/10.  Patient described the pain is "spasmatic".  No palliative measure for complaint.  Past Medical History:  Diagnosis Date  . Arthritis   . Asthma   . Diabetes mellitus without complication (HCC)   . Glaucoma   . Gout   . Hypertension   . Renal disorder   . Thyroid disease     There are no active problems to display for this patient.   Past Surgical History:  Procedure Laterality Date  . CHOLECYSTECTOMY    . MINOR CARPAL TUNNEL    . TUBAL LIGATION      Prior to Admission medications   Medication Sig Start Date End Date Taking? Authorizing Provider  aspirin 81 MG chewable tablet Chew 81 mg by mouth daily.    [provider]  atorvastatin (LIPITOR) 20 MG tablet Take 20 mg by mouth daily.    [provider]  carvedilol (COREG) 6.25 MG tablet Take 6.25 mg by mouth 2 (two) times daily with a meal.    [provider]  chlorthalidone (HYGROTON) 25 MG tablet Take 25 mg by mouth daily.    [provider]  colchicine 0.6 MG tablet Take 1 tablet (0.6 mg total) by mouth 2 (two) times daily. 06/08/16 06/08/17  Tommi Rumps, PA-C  cyclobenzaprine (FLEXERIL) 10 MG tablet Take 1 tablet (10 mg total) by mouth 3 (three) times daily as needed. 12/03/17   Joni Reining, PA-C  glipiZIDE (GLUCOTROL) 10  MG tablet Take 10 mg by mouth daily before breakfast.    [provider]  levothyroxine (SYNTHROID, LEVOTHROID) 112 MCG tablet Take 112 mcg by mouth daily before breakfast.    [provider]  traMADol (ULTRAM) 50 MG tablet Take 1 tablet (50 mg total) every 6 (six) hours as needed by mouth. 02/17/17 02/17/18  Joni Reining, PA-C    Allergies Amoxicillin and Penicillins  No family history on file.  Social History Social History   Tobacco Use  . Smoking status: Never Smoker  . Smokeless tobacco: Never Used  Substance Use Topics  . Alcohol use: No  . Drug use: No    Review of Systems Constitutional: No fever/chills Eyes: No visual changes. ENT: No sore throat. Cardiovascular: Denies chest pain. Respiratory: Denies shortness of breath. Gastrointestinal: No abdominal pain.  No nausea, no vomiting.  No diarrhea.  No constipation. Genitourinary: Negative for dysuria. Musculoskeletal: Positive for back pain. Skin: Negative for rash. Neurological: Negative for headaches, focal weakness or numbness. Endocrine:Diabetes, hypertension, and hypothyroidism. Allergic/Immunilogical: Penicillin. ____________________________________________   PHYSICAL EXAM:  VITAL SIGNS: ED Triage Vitals  Enc Vitals Group     BP 12/03/17 1232 (!) 149/65     Pulse Rate 12/03/17 1232 (!) 56     Resp 12/03/17 1232 20     Temp 12/03/17 1232 98.7 F (37.1 C)     Temp Source  12/03/17 1232 Oral     SpO2 12/03/17 1232 97 %     Weight 12/03/17 1233 173 lb (78.5 kg)     Height 12/03/17 1233 4\' 11"  (1.499 m)     Head Circumference --      Peak Flow --      Pain Score 12/03/17 1251 4     Pain Loc --      Pain Edu? --      Excl. in GC? --    Constitutional: Alert and oriented. Well appearing and in no acute distress.  No cervical lymphadenopathy. Cardiovascular: Normal rate, regular rhythm. Grossly normal heart sounds.  Good peripheral circulation. Respiratory: Normal respiratory  effort.  No retractions. Lungs CTAB. Gastrointestinal: Soft and nontender. No distention. No abdominal bruits. No CVA tenderness. Genitourinary: Deferred Musculoskeletal: No obvious spinal deformity.  Patient decreased range of motion with left lateral movements.  Patient right paraspinal muscle spasm with lateral movements.  Patient had negative straight leg test in supine position.  No lower extremity tenderness nor edema.  No joint effusions. Neurologic:  Normal speech and language. No gross focal neurologic deficits are appreciated. No gait instability. Skin:  Skin is warm, dry and intact. No rash noted. Psychiatric: Mood and affect are normal. Speech and behavior are normal.  ____________________________________________   LABS (all labs ordered are listed, but only abnormal results are displayed)  Labs Reviewed - No data to display ____________________________________________  EKG   ____________________________________________  RADIOLOGY  ED MD interpretation:    Official radiology report(s): No results found.  ____________________________________________   PROCEDURES  Procedure(s) performed:   Procedures  Critical Care performed: No  ____________________________________________   INITIAL IMPRESSION / ASSESSMENT AND PLAN / ED COURSE  As part of my medical decision making, I reviewed the following data within the electronic MEDICAL RECORD NUMBER    Paraspinal muscle strain.  Patient given discharge care instruction.  Patient advised take medication as directed follow-up PCP.      ____________________________________________   FINAL CLINICAL IMPRESSION(S) / ED DIAGNOSES  Final diagnoses:  Strain of lumbar region, initial encounter     ED Discharge Orders         Ordered    cyclobenzaprine (FLEXERIL) 10 MG tablet  3 times daily PRN     12/03/17 1344           Note:  This document was prepared using Dragon voice recognition software and may include  unintentional dictation errors.    12/05/17, PA-C 12/03/17 1348    12/05/17, MD 12/03/17 [phone]

## 2017-12-13 IMAGING — DX DG HAND COMPLETE 3+V*R*
3 series · 3 of 3 positions shown · non-contrast
Comparison: 11/25/2015

CLINICAL DATA: Pain and swelling in the right hand.

EXAM:
RIGHT HAND - COMPLETE 3+ VIEW

[hand ap]
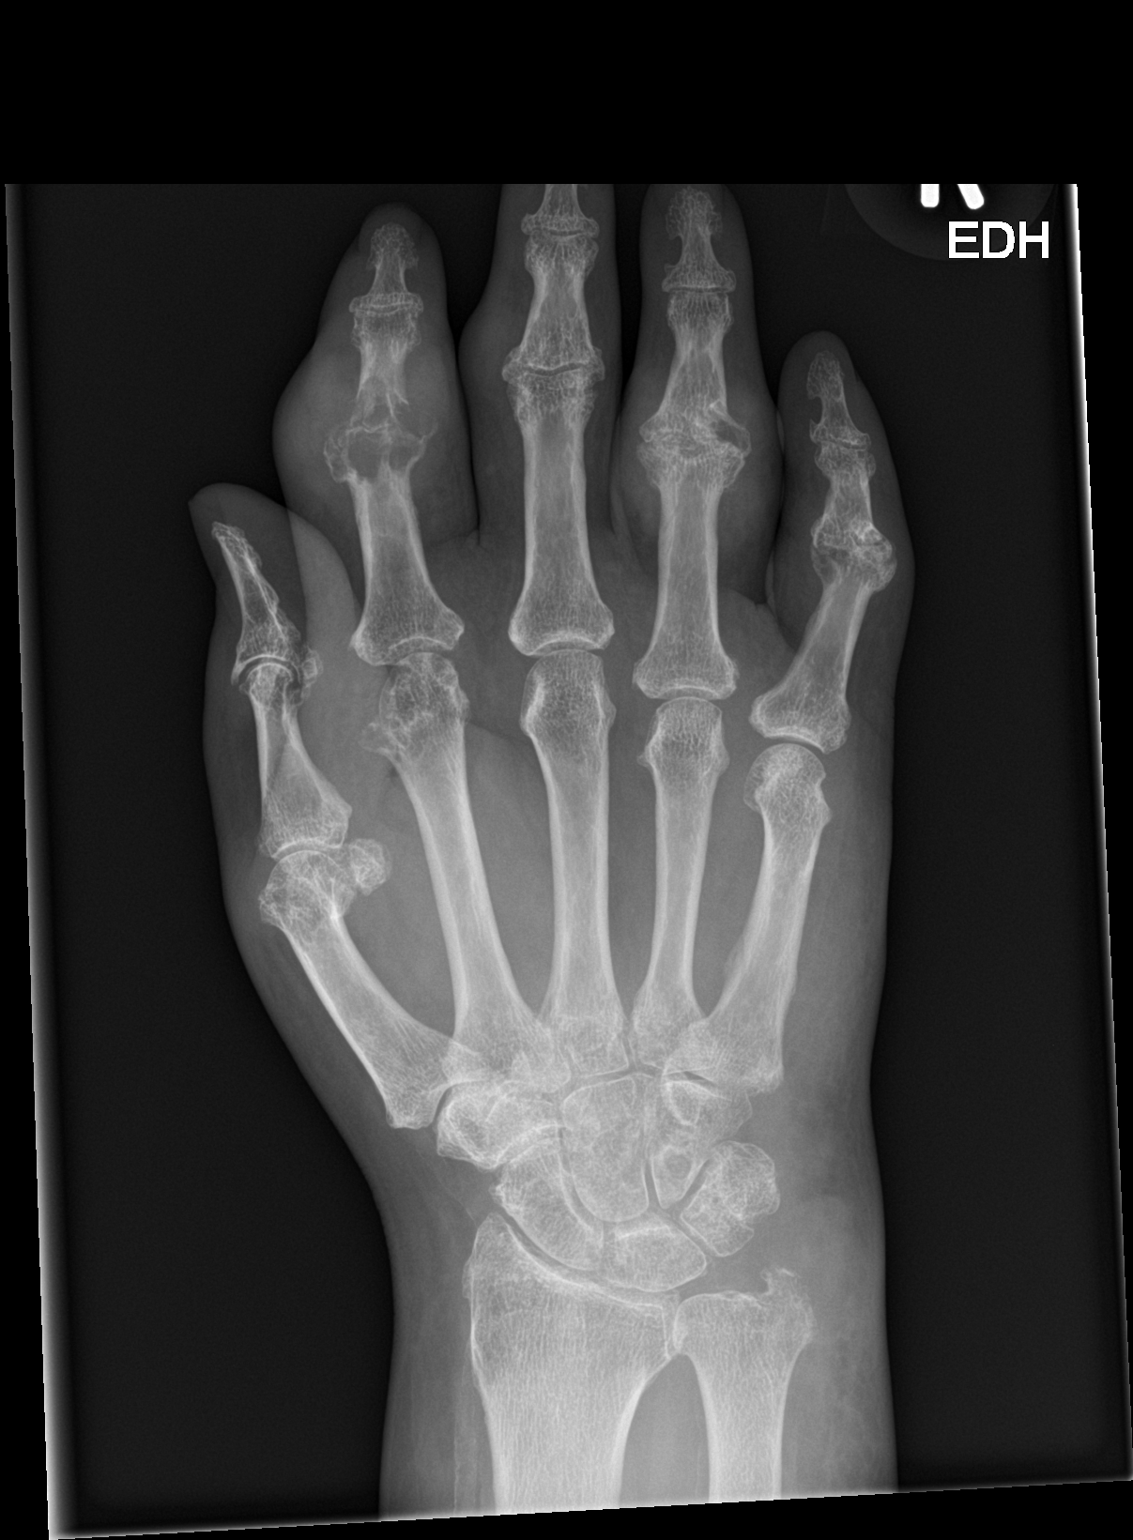

[hand obl]
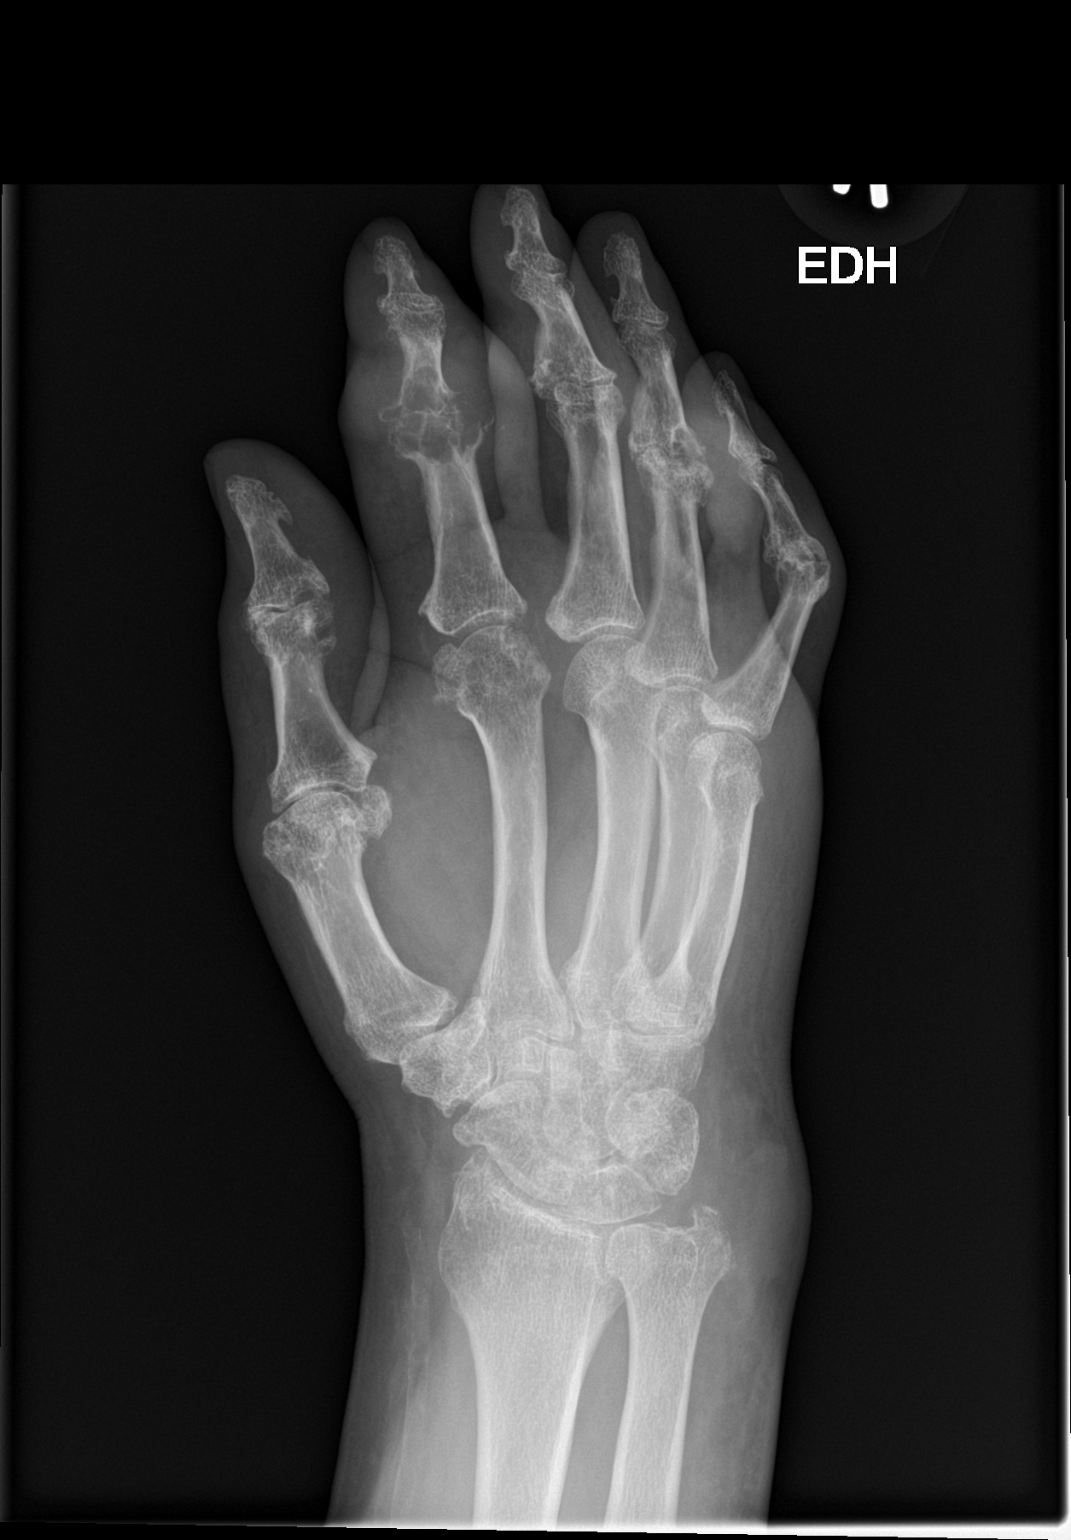

[hand lat]
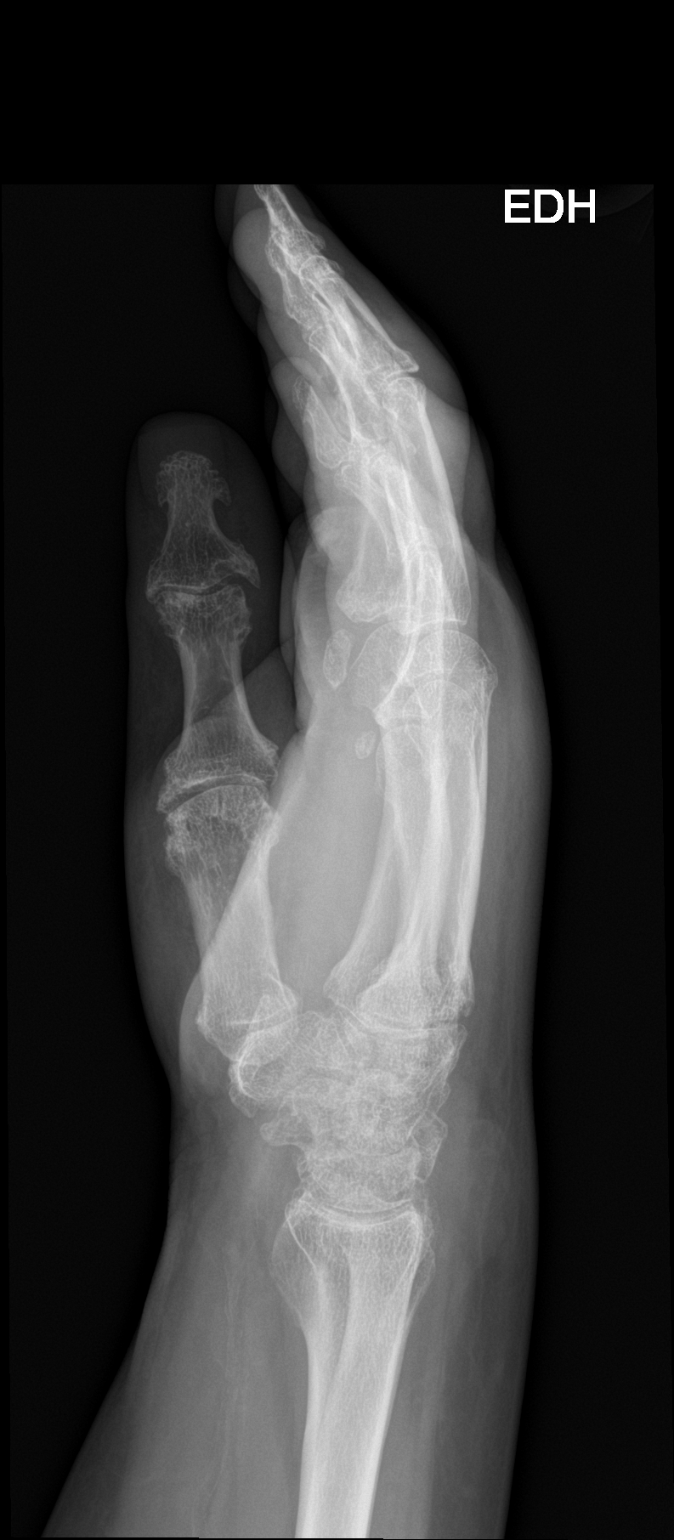

[3 of 3 positions shown; findings below may reference images not displayed]

FINDINGS: Generalized osteopenia.

No acute fracture or dislocation.

Mild osteoarthritis of the first CMC joint. Mild osteoarthritis of
the first MCP joint. Moderate osteoarthritis of the first IP joint
and second MCP joint.

Moderate -severe osteoarthritis of the fourth PIP joint with a
periarticular lucency along the ulnar base of the fourth middle
phalanx with soft tissue swelling around the fourth PIP joint.

Moderate-severe osteoarthritis of the fifth PIP joint with a
periarticular lucency along the radial aspect of the head of the
fifth proximal phalanx.

Severe osteoarthritis of the second PIP joint with extensive
periarticular lucency at the head of the second proximal phalanx and
base of the second middle phalanx with erosive changes and severe
surrounding soft tissue swelling consistent with a crystalline
arthropathy such as gout.

Focal soft tissue swelling along the radial aspect of the third PIP
joint.

Intraosseous cyst in the proximal aspect of the hamate.
IMPRESSION: 1. Scattered arthropathy of the right hand as described above.
2. Severe osteoarthritis of the second PIP joint with extensive
periarticular lucency at the head of the second proximal phalanx and
base of the second middle phalanx with erosive changes and severe
surrounding soft tissue swelling consistent with a crystalline
arthropathy such as gout.

## 2018-05-24 DIAGNOSIS — L304 Erythema intertrigo: Secondary | ICD-10-CM | POA: Insufficient documentation

## 2018-11-16 ENCOUNTER — Other Ambulatory Visit: Payer: Self-pay

## 2018-11-16 ENCOUNTER — Encounter: Payer: Self-pay | Admitting: Emergency Medicine

## 2018-11-16 ENCOUNTER — Emergency Department
Admission: EM | Admit: 2018-11-16 | Discharge: 2018-11-16 | Disposition: A | Discharge status: Home / Self Care | Payer: Medicare Other | Attending: Emergency Medicine | Admitting: Emergency Medicine

## 2018-11-16 DIAGNOSIS — L02511 Cutaneous abscess of right hand: Secondary | ICD-10-CM

## 2018-11-16 DIAGNOSIS — M06849 Other specified rheumatoid arthritis, unspecified hand: Secondary | ICD-10-CM | POA: Insufficient documentation

## 2018-11-16 DIAGNOSIS — J45909 Unspecified asthma, uncomplicated: Secondary | ICD-10-CM | POA: Insufficient documentation

## 2018-11-16 DIAGNOSIS — Z7982 Long term (current) use of aspirin: Secondary | ICD-10-CM | POA: Insufficient documentation

## 2018-11-16 DIAGNOSIS — E079 Disorder of thyroid, unspecified: Secondary | ICD-10-CM | POA: Insufficient documentation

## 2018-11-16 DIAGNOSIS — Z79899 Other long term (current) drug therapy: Secondary | ICD-10-CM | POA: Insufficient documentation

## 2018-11-16 DIAGNOSIS — M069 Rheumatoid arthritis, unspecified: Secondary | ICD-10-CM

## 2018-11-16 DIAGNOSIS — I10 Essential (primary) hypertension: Secondary | ICD-10-CM | POA: Diagnosis not present

## 2018-11-16 MED ORDER — TRAMADOL HCL 50 MG PO TABS: 50.0000 mg | ORAL_TABLET | Freq: Two times a day (BID) | ORAL | 0 refills | Status: AC | PRN

## 2018-11-16 MED ORDER — SULFAMETHOXAZOLE-TRIMETHOPRIM 800-160 MG PO TABS: 1.0000 | ORAL_TABLET | Freq: Two times a day (BID) | ORAL | 0 refills | Status: DC

## 2018-11-16 NOTE — ED Provider Notes (Signed)
[facility] Emergency Department Provider Note   ____________________________________________   First MD Initiated Contact with Patient 11/16/18 1416     (approximate)  I have reviewed the triage vital signs and the nursing notes.   HISTORY  Chief Complaint No chief complaint on file.    HPI Shari Malone is a 68 y.o. female patient presents for ruptured abscess dorsal aspect of the MPJ of the third digit right hand.  Patient stated abscess appeared 3 days ago it started draining last night.  Patient has a history of rheumatoid arthritis affecting the digits of the bilateral hands and gout.  Patient is not taking gout medication in over 4 months.  Patient states she is scheduled to see her rheumatologist next week.  Patient denies fever associated complaint.  Patient denies loss of sensation.  Patient states decreased range of motion secondary to rheumatoid arthritis of the bilateral hand.  Patient denies pain at this time.  Patient is right-hand dominant.         Past Medical History:  Diagnosis Date  . Arthritis   . Asthma   . Diabetes mellitus without complication (HCC)   . Glaucoma   . Gout   . Hypertension   . Renal disorder   . Thyroid disease     There are no active problems to display for this patient.   Past Surgical History:  Procedure Laterality Date  . CHOLECYSTECTOMY    . MINOR CARPAL TUNNEL    . TUBAL LIGATION      Prior to Admission medications   Medication Sig Start Date End Date Taking? Authorizing Provider  aspirin 81 MG chewable tablet Chew 81 mg by mouth daily.    [provider]  atorvastatin (LIPITOR) 20 MG tablet Take 20 mg by mouth daily.    [provider]  carvedilol (COREG) 6.25 MG tablet Take 6.25 mg by mouth 2 (two) times daily with a meal.    [provider]  chlorthalidone (HYGROTON) 25 MG tablet Take 25 mg by mouth daily.    [provider]  colchicine 0.6 MG tablet  Take 1 tablet (0.6 mg total) by mouth 2 (two) times daily. 06/08/16 06/08/17  Tommi RumpsSummers, Rhonda L, PA-C  cyclobenzaprine (FLEXERIL) 10 MG tablet Take 1 tablet (10 mg total) by mouth 3 (three) times daily as needed. 12/03/17   Joni ReiningSmith, Ashiah Karpowicz K, PA-C  glipiZIDE (GLUCOTROL) 10 MG tablet Take 10 mg by mouth daily before breakfast.    [provider]  levothyroxine (SYNTHROID, LEVOTHROID) 112 MCG tablet Take 112 mcg by mouth daily before breakfast.    [provider]  sulfamethoxazole-trimethoprim (BACTRIM DS) 800-160 MG tablet Take 1 tablet by mouth 2 (two) times daily. 11/16/18   Joni ReiningSmith, Snigdha Howser K, PA-C  traMADol (ULTRAM) 50 MG tablet Take 1 tablet (50 mg total) by mouth every 12 (twelve) hours as needed for up to 6 days. 11/16/18 11/22/18  Joni ReiningSmith, Trace Wirick K, PA-C    Allergies Amoxicillin and Penicillins  No family history on file.  Social History Social History   Tobacco Use  . Smoking status: Never Smoker  . Smokeless tobacco: Never Used  Substance Use Topics  . Alcohol use: No  . Drug use: No    Review of Systems Constitutional: No fever/chills Eyes: No visual changes. ENT: No sore throat. Cardiovascular: Denies chest pain. Respiratory: Denies shortness of breath. Gastrointestinal: No abdominal pain.  No nausea, no vomiting.  No diarrhea.  No constipation. Genitourinary: Negative for dysuria. Musculoskeletal:  Rheumatoid arthritis. Skin: Ruptured nodule lesion right middle finger. Neurological: Negative for headaches, focal weakness or numbness. Endocrine:  Diabetes, hyperlipidemia, hypothyroidism, and gout. Allergic/Immunilogical: Penicillin. ____________________________________________   PHYSICAL EXAM:  VITAL SIGNS: ED Triage Vitals [11/16/18 1412]  Enc Vitals Group     BP (!) 165/61     Pulse Rate 66     Resp 16     Temp 98.8 F (37.1 C)     Temp Source Oral     SpO2 100 %     Weight 175 lb (79.4 kg)     Height 4\' 11"  (1.499 m)     Head Circumference       Peak Flow      Pain Score 0     Pain Loc      Pain Edu?      Excl. in [place]?     Constitutional: Alert and oriented. Well appearing and in no acute distress. Cardiovascular: Normal rate, regular rhythm. Grossly normal heart sounds.  Good peripheral circulation. Respiratory: Normal respiratory effort.  No retractions. Lungs CTAB. Musculoskeletal: Lateral hand and finger deformity secondary to rheumatoid arthritis.   Neurologic:  Normal speech and language. No gross focal neurologic deficits are appreciated. No gait instability. Skin: Ruptured nodule lesion MPJ third digit right hand. Psychiatric: Mood and affect are normal. Speech and behavior are normal.  ____________________________________________   LABS (all labs ordered are listed, but only abnormal results are displayed)  Labs Reviewed - No data to display ____________________________________________  EKG   ____________________________________________  RADIOLOGY  ED MD interpretation:    Official radiology report(s): No results found.  ____________________________________________   PROCEDURES  Procedure(s) performed (including Critical Care):  Procedures   ____________________________________________   INITIAL IMPRESSION / ASSESSMENT AND PLAN / ED COURSE  As part of my medical decision making, I reviewed the following data within the [place] Friedmann was evaluated in Emergency Department on 11/16/2018 for the symptoms described in the history of present illness. She was evaluated in the context of the global COVID-19 pandemic, which necessitated consideration that the patient might be at risk for infection with the SARS-CoV-2 virus that causes COVID-19. Institutional protocols and algorithms that pertain to the evaluation of patients at risk for COVID-19 are in a state of rapid change based on information released by regulatory bodies including the CDC and federal and state  organizations. These policies and algorithms were followed during the patient's care in the ED.  Patient presents for ruptured abscess dorsal aspect of the third digit right hand.  Purulent material was expressed.  Wound was irrigated and bandaged.  Patient given discharge care instruction advised take medication as directed.  Patient advised return back in 2 days for wound recheck.  Patient advised to follow-up with rheumatologist per schedule.      ____________________________________________   FINAL CLINICAL IMPRESSION(S) / ED DIAGNOSES  Final diagnoses:  Abscess of right middle finger  Rheumatoid arthritis involving both hands, unspecified rheumatoid factor presence [facility])     ED Discharge Orders         Ordered    sulfamethoxazole-trimethoprim (BACTRIM DS) 800-160 MG tablet  2 times daily     11/16/18 1518    traMADol (ULTRAM) 50 MG tablet  Every 12 hours PRN     11/16/18 1518           Note:  This document was prepared using Dragon voice recognition software and may include  unintentional dictation errors.    Joni Reining, PA-C 11/16/18 1529    Emily Filbert, MD 11/17/18 [phone]

## 2018-11-16 NOTE — Discharge Instructions (Signed)
Follow discharge care instructions and take medication as directed.  Advised to contact rheumatologist for reevaluation of your rheumatoid condition and gout.

## 2018-11-16 NOTE — ED Triage Notes (Signed)
Pt c/o RT 3rd digit pain and swelling. Pt states abscess started on finger and opened and started draining last night. PT also has gout in affected hand. NAD noted

## 2018-11-17 DIAGNOSIS — M1A00X1 Idiopathic chronic gout, unspecified site, with tophus (tophi): Secondary | ICD-10-CM | POA: Insufficient documentation

## 2018-11-18 ENCOUNTER — Emergency Department
Admission: EM | Admit: 2018-11-18 | Discharge: 2018-11-18 | Disposition: A | Discharge status: Home / Self Care | Payer: Medicare Other | Attending: Emergency Medicine | Admitting: Emergency Medicine

## 2018-11-18 ENCOUNTER — Encounter: Payer: Self-pay | Admitting: Emergency Medicine

## 2018-11-18 ENCOUNTER — Other Ambulatory Visit: Payer: Self-pay

## 2018-11-18 DIAGNOSIS — Z7984 Long term (current) use of oral hypoglycemic drugs: Secondary | ICD-10-CM | POA: Insufficient documentation

## 2018-11-18 DIAGNOSIS — Z7982 Long term (current) use of aspirin: Secondary | ICD-10-CM | POA: Insufficient documentation

## 2018-11-18 DIAGNOSIS — E119 Type 2 diabetes mellitus without complications: Secondary | ICD-10-CM | POA: Diagnosis not present

## 2018-11-18 DIAGNOSIS — Z5189 Encounter for other specified aftercare: Secondary | ICD-10-CM

## 2018-11-18 DIAGNOSIS — J45909 Unspecified asthma, uncomplicated: Secondary | ICD-10-CM | POA: Insufficient documentation

## 2018-11-18 DIAGNOSIS — I10 Essential (primary) hypertension: Secondary | ICD-10-CM | POA: Diagnosis not present

## 2018-11-18 DIAGNOSIS — Z79899 Other long term (current) drug therapy: Secondary | ICD-10-CM | POA: Diagnosis not present

## 2018-11-18 DIAGNOSIS — Z4801 Encounter for change or removal of surgical wound dressing: Secondary | ICD-10-CM | POA: Insufficient documentation

## 2018-11-18 NOTE — Discharge Instructions (Addendum)
Past 3 days Epson salt soaks for 5 minutes twice a day.  Continue previous medications.  Follow-up with PCP.

## 2018-11-18 NOTE — ED Triage Notes (Signed)
Pt to ED via POV for wound recheck. Pt denies any issues. Pt is in NAD.

## 2018-11-18 NOTE — ED Notes (Signed)
Pt alert and oriented X4, active, cooperative, pt in NAD. RR even and unlabored, color WNL.  Pt informed to return if any life threatening symptoms occur.  Discharge and followup instructions reviewed. Ambulates safely. 

## 2018-11-18 NOTE — ED Provider Notes (Signed)
[facility] Emergency Department Provider Note   ____________________________________________   First MD Initiated Contact with Patient 11/18/18 1251     (approximate)  I have reviewed the triage vital signs and the nursing notes.   HISTORY  Chief Complaint Wound Check    HPI Shari Malone is a 68 y.o. female patient presents for wound check secondary to persistent of the third digit right hand.  Lesion had a spontaneous rupture 2 days ago.  Patient was placed on antibiotics and is here today for reevaluation.  Patient states no pain and noticed decreased swelling and no active drainage.  Patient condition is complicated by rheumatoid arthritis affecting the fingers of the bilateral hand.  Patient saw her rheumatologist yesterday has restarted colchicine         Past Medical History:  Diagnosis Date  . Arthritis   . Asthma   . Diabetes mellitus without complication ([place])   . Glaucoma   . Gout   . Hypertension   . Renal disorder   . Thyroid disease     There are no active problems to display for this patient.   Past Surgical History:  Procedure Laterality Date  . CHOLECYSTECTOMY    . MINOR CARPAL TUNNEL    . TUBAL LIGATION      Prior to Admission medications   Medication Sig Start Date End Date Taking? Authorizing Provider  aspirin 81 MG chewable tablet Chew 81 mg by mouth daily.    [provider]  atorvastatin (LIPITOR) 20 MG tablet Take 20 mg by mouth daily.    [provider]  carvedilol (COREG) 6.25 MG tablet Take 6.25 mg by mouth 2 (two) times daily with a meal.    [provider]  chlorthalidone (HYGROTON) 25 MG tablet Take 25 mg by mouth daily.    [provider]  colchicine 0.6 MG tablet Take 1 tablet (0.6 mg total) by mouth 2 (two) times daily. 06/08/16 06/08/17  Johnn Hai, PA-C  cyclobenzaprine (FLEXERIL) 10 MG tablet Take 1 tablet (10 mg total) by mouth 3 (three) times daily as  needed. 12/03/17   Sable Feil, PA-C  glipiZIDE (GLUCOTROL) 10 MG tablet Take 10 mg by mouth daily before breakfast.    [provider]  levothyroxine (SYNTHROID, LEVOTHROID) 112 MCG tablet Take 112 mcg by mouth daily before breakfast.    [provider]  sulfamethoxazole-trimethoprim (BACTRIM DS) 800-160 MG tablet Take 1 tablet by mouth 2 (two) times daily. 11/16/18   Sable Feil, PA-C  traMADol (ULTRAM) 50 MG tablet Take 1 tablet (50 mg total) by mouth every 12 (twelve) hours as needed for up to 6 days. 11/16/18 11/22/18  Sable Feil, PA-C    Allergies Amoxicillin and Penicillins  No family history on file.  Social History Social History   Tobacco Use  . Smoking status: Never Smoker  . Smokeless tobacco: Never Used  Substance Use Topics  . Alcohol use: No  . Drug use: No    Review of Systems Constitutional: No fever/chills Eyes: No visual changes. ENT: No sore throat. Cardiovascular: Denies chest pain. Respiratory: Denies shortness of breath. Gastrointestinal: No abdominal pain.  No nausea, no vomiting.  No diarrhea.  No constipation. Genitourinary: Negative for dysuria. Musculoskeletal: Negative for back pain. Skin: Negative for rash.  Abscess third digit right hand. Neurological: Negative for headaches, focal weakness or numbness. Endocrine:  Diabetes, hyperlipidemia, hypothyroidism, and gout. Allergic/Immunilogical: Penicillin. ____________________________________________   PHYSICAL EXAM:  VITAL SIGNS:  ED Triage Vitals  Enc Vitals Group     BP 11/18/18 1218 (!) 140/51     Pulse Rate 11/18/18 1218 (!) 56     Resp 11/18/18 1218 16     Temp 11/18/18 1218 98 F (36.7 C)     Temp Source 11/18/18 1218 Oral     SpO2 11/18/18 1218 97 %     Weight --      Height --      Head Circumference --      Peak Flow --      Pain Score 11/18/18 1217 0     Pain Loc --      Pain Edu? --      Excl. in [place]? --     Constitutional: Alert and oriented.  Well appearing and in no acute distress. Cardiovascular: Normal rate, regular rhythm. Grossly normal heart sounds.  Good peripheral circulation. Respiratory: Normal respiratory effort.  No retractions. Lungs CTAB. Musculoskeletal: Bilateral bony deformities bilateral hands secondary to arthritis.   Neurologic:  Normal speech and language. No gross focal neurologic deficits are appreciated. No gait instability. Skin:  Skin is warm, dry and intact.  Decreased edema and erythema to the third digit right hand.   Psychiatric: Mood and affect are normal. Speech and behavior are normal.  ____________________________________________   LABS (all labs ordered are listed, but only abnormal results are displayed)  Labs Reviewed - No data to display ____________________________________________  EKG   ____________________________________________  RADIOLOGY  ED MD interpretation:    Official radiology report(s): No results found.  ____________________________________________   PROCEDURES  Procedure(s) performed (including Critical Care):  Procedures   ____________________________________________   INITIAL IMPRESSION / ASSESSMENT AND PLAN / ED COURSE  As part of my medical decision making, I reviewed the following data within the electronic MEDICAL RECORD NUMBER         Shari Malone was evaluated in Emergency Department on 11/18/2018 for the symptoms described in the history of present illness. She was evaluated in the context of the global COVID-19 pandemic, which necessitated consideration that the patient might be at risk for infection with the SARS-CoV-2 virus that causes COVID-19. Institutional protocols and algorithms that pertain to the evaluation of patients at risk for COVID-19 are in a state of rapid change based on information released by regulatory bodies including the CDC and federal and state organizations. These policies and algorithms were followed during the patient's  care in the ED.  Patient presents for evaluation of an abscess to the third digit right hand spontaneous rupture.  The exam today shows decreased edema erythema.  No active drainage.  Wound was cleaned and reviewed bandage.  Patient given discharge care instruction advised continue previous medications.      ____________________________________________   FINAL CLINICAL IMPRESSION(S) / ED DIAGNOSES  Final diagnoses:  Visit for wound check     ED Discharge Orders    None       Note:  This document was prepared using Dragon voice recognition software and may include unintentional dictation errors.    Joni Reining, PA-C 11/18/18 1322    Sharman Cheek, MD 11/28/18 1538

## 2018-11-22 ENCOUNTER — Emergency Department
Admission: EM | Admit: 2018-11-22 | Discharge: 2018-11-22 | Disposition: A | Discharge status: Home / Self Care | Payer: Medicare Other | Attending: Emergency Medicine | Admitting: Emergency Medicine

## 2018-11-22 ENCOUNTER — Other Ambulatory Visit: Payer: Self-pay

## 2018-11-22 ENCOUNTER — Encounter: Payer: Self-pay | Admitting: Emergency Medicine

## 2018-11-22 DIAGNOSIS — I10 Essential (primary) hypertension: Secondary | ICD-10-CM | POA: Diagnosis not present

## 2018-11-22 DIAGNOSIS — J45909 Unspecified asthma, uncomplicated: Secondary | ICD-10-CM | POA: Diagnosis not present

## 2018-11-22 DIAGNOSIS — E119 Type 2 diabetes mellitus without complications: Secondary | ICD-10-CM | POA: Insufficient documentation

## 2018-11-22 DIAGNOSIS — Z7984 Long term (current) use of oral hypoglycemic drugs: Secondary | ICD-10-CM | POA: Diagnosis not present

## 2018-11-22 DIAGNOSIS — Z4800 Encounter for change or removal of nonsurgical wound dressing: Secondary | ICD-10-CM | POA: Diagnosis not present

## 2018-11-22 DIAGNOSIS — M069 Rheumatoid arthritis, unspecified: Secondary | ICD-10-CM | POA: Diagnosis not present

## 2018-11-22 DIAGNOSIS — Z79899 Other long term (current) drug therapy: Secondary | ICD-10-CM | POA: Insufficient documentation

## 2018-11-22 DIAGNOSIS — Z5189 Encounter for other specified aftercare: Secondary | ICD-10-CM

## 2018-11-22 NOTE — ED Provider Notes (Signed)
[facility] Emergency Department Provider Note   ____________________________________________   First MD Initiated Contact with Patient 11/22/18 [phone]     (approximate)  I have reviewed the triage vital signs and the nursing notes.   HISTORY  Chief Complaint Wound Check   HPI Shari Malone is a 68 y.o. female presents to the ED for wound recheck.  Patient was seen on 11/18/2022 the same wound to her third digit right hand and has been taking antibiotics since the seventh.  Patient states that swelling has decreased and overall is doing much better.  She continues to take the antibiotic.  Patient has a history of rheumatoid arthritis affecting fingers bilateral hands.  She denies any pain at this time.      Past Medical History:  Diagnosis Date  . Arthritis   . Asthma   . Diabetes mellitus without complication (HCC)   . Glaucoma   . Gout   . Hypertension   . Renal disorder   . Thyroid disease     There are no active problems to display for this patient.   Past Surgical History:  Procedure Laterality Date  . CHOLECYSTECTOMY    . MINOR CARPAL TUNNEL    . TUBAL LIGATION      Prior to Admission medications   Medication Sig Start Date End Date Taking? Authorizing Provider  aspirin 81 MG chewable tablet Chew 81 mg by mouth daily.    [provider]  atorvastatin (LIPITOR) 20 MG tablet Take 20 mg by mouth daily.    [provider]  carvedilol (COREG) 6.25 MG tablet Take 6.25 mg by mouth 2 (two) times daily with a meal.    [provider]  chlorthalidone (HYGROTON) 25 MG tablet Take 25 mg by mouth daily.    [provider]  colchicine 0.6 MG tablet Take 1 tablet (0.6 mg total) by mouth 2 (two) times daily. 06/08/16 06/08/17  Tommi Rumps, PA-C  cyclobenzaprine (FLEXERIL) 10 MG tablet Take 1 tablet (10 mg total) by mouth 3 (three) times daily as needed. 12/03/17   Joni Reining, PA-C  glipiZIDE (GLUCOTROL) 10  MG tablet Take 10 mg by mouth daily before breakfast.    [provider]  levothyroxine (SYNTHROID, LEVOTHROID) 112 MCG tablet Take 112 mcg by mouth daily before breakfast.    [provider]  sulfamethoxazole-trimethoprim (BACTRIM DS) 800-160 MG tablet Take 1 tablet by mouth 2 (two) times daily. 11/16/18   Joni Reining, PA-C  traMADol (ULTRAM) 50 MG tablet Take 1 tablet (50 mg total) by mouth every 12 (twelve) hours as needed for up to 6 days. 11/16/18 11/22/18  Joni Reining, PA-C    Allergies Amoxicillin and Penicillins  History reviewed. No pertinent family history.  Social History Social History   Tobacco Use  . Smoking status: Never Smoker  . Smokeless tobacco: Never Used  Substance Use Topics  . Alcohol use: No  . Drug use: No    Review of Systems Constitutional: No fever/chills Cardiovascular: Denies chest pain. Respiratory: Denies shortness of breath. Musculoskeletal: Positive for rheumatoid arthritis. Skin: Positive open wound. Neurological: Negative for focal weakness or numbness. ____________________________________________   PHYSICAL EXAM:  VITAL SIGNS: ED Triage Vitals  Enc Vitals Group     BP 11/22/18 0937 (!) 136/53     Pulse Rate 11/22/18 0937 60     Resp 11/22/18 0937 18     Temp 11/22/18 0937 98.5 F (36.9 C)  Temp Source 11/22/18 0937 Oral     SpO2 11/22/18 0937 99 %     Weight 11/22/18 0936 175 lb (79.4 kg)     Height 11/22/18 0936 4\' 11"  (1.499 m)     Head Circumference --      Peak Flow --      Pain Score 11/22/18 0936 0     Pain Loc --      Pain Edu? --      Excl. in [place]? --     Constitutional: Alert and oriented. Well appearing and in no acute distress. Eyes: Conjunctivae are normal.  Head: Atraumatic. Neck: No stridor.   Cardiovascular: Normal rate, regular rhythm. Grossly normal heart sounds.  Good peripheral circulation. Respiratory: Normal respiratory effort.  No retractions. Lungs CTAB. Musculoskeletal:  Moves upper and lower extremities without any difficulty.  Multiple digits show deformity secondary to her rheumatoid arthritis. Neurologic:  Normal speech and language. No gross focal neurologic deficits are appreciated. No gait instability. Skin:  Skin is warm, dry.  Right hand third digit has a small open wound that currently is not draining.  There is no erythema or warmth noted.  Nontender to palpation.  There is some skin that is peeling off in the area.  Patient states that this is a huge improvement over when it initially became infected. Psychiatric: Mood and affect are normal. Speech and behavior are normal.   PROCEDURES  Procedure(s) performed (including Critical Care):  Procedures   ____________________________________________   INITIAL IMPRESSION / ASSESSMENT AND PLAN / ED COURSE  As part of my medical decision making, I reviewed the following data within the electronic MEDICAL RECORD NUMBER Notes from prior ED visits and [place] Controlled Substance Database  68 year old female presents to the ED for wound recheck of her right third digit.  Patient had an infection of her third finger and was placed on antibiotics.  She states that since that time it is improved greatly.  Today there is no erythema or drainage from the area and nontender to palpation.  Patient will continue taking antibiotics until completely finished.  She is to follow-up with her PCP if any continued problems.  ____________________________________________   FINAL CLINICAL IMPRESSION(S) / ED DIAGNOSES  Final diagnoses:  Encounter for wound re-check     ED Discharge Orders    None       Note:  This document was prepared using Dragon voice recognition software and may include unintentional dictation errors.    Johnn Hai, PA-C 11/22/18 1109    Lavonia Drafts, MD 11/22/18 1321

## 2018-11-22 NOTE — Discharge Instructions (Addendum)
Follow-up with your primary care provider if any continued problems.  Return to the emergency department if any severe worsening of your symptoms.  Continue taking your antibiotic as directed.  Use warm compresses to your hand as needed and cover your finger when out.  You may leave it open when you are at home.

## 2018-11-22 NOTE — ED Notes (Signed)
See triage note  Presents for wound check to right index finger

## 2018-11-22 NOTE — ED Triage Notes (Signed)
Pt presents to ED via POV for wound recheck.

## 2019-09-06 ENCOUNTER — Encounter: Payer: Self-pay | Admitting: Emergency Medicine

## 2019-09-06 ENCOUNTER — Other Ambulatory Visit: Payer: Self-pay

## 2019-09-06 ENCOUNTER — Emergency Department
Admission: EM | Admit: 2019-09-06 | Discharge: 2019-09-06 | Disposition: A | Discharge status: Home / Self Care | Payer: Medicare Other | Attending: Emergency Medicine | Admitting: Emergency Medicine

## 2019-09-06 DIAGNOSIS — M436 Torticollis: Secondary | ICD-10-CM | POA: Diagnosis not present

## 2019-09-06 DIAGNOSIS — Z7984 Long term (current) use of oral hypoglycemic drugs: Secondary | ICD-10-CM | POA: Insufficient documentation

## 2019-09-06 DIAGNOSIS — Z79899 Other long term (current) drug therapy: Secondary | ICD-10-CM | POA: Insufficient documentation

## 2019-09-06 DIAGNOSIS — I10 Essential (primary) hypertension: Secondary | ICD-10-CM | POA: Insufficient documentation

## 2019-09-06 DIAGNOSIS — E119 Type 2 diabetes mellitus without complications: Secondary | ICD-10-CM | POA: Diagnosis not present

## 2019-09-06 DIAGNOSIS — J45909 Unspecified asthma, uncomplicated: Secondary | ICD-10-CM | POA: Insufficient documentation

## 2019-09-06 DIAGNOSIS — Z7982 Long term (current) use of aspirin: Secondary | ICD-10-CM | POA: Diagnosis not present

## 2019-09-06 DIAGNOSIS — M542 Cervicalgia: Secondary | ICD-10-CM | POA: Diagnosis present

## 2019-09-06 MED ORDER — CYCLOBENZAPRINE HCL 10 MG PO TABS: 10.0000 mg | ORAL_TABLET | Freq: Three times a day (TID) | ORAL | 0 refills | Status: DC | PRN

## 2019-09-06 MED ORDER — MELOXICAM 15 MG PO TABS: 15.0000 mg | ORAL_TABLET | Freq: Every day | ORAL | 2 refills | Status: AC

## 2019-09-06 NOTE — ED Provider Notes (Signed)
[facility] Emergency Department Provider Note  ____________________________________________   First MD Initiated Contact with Patient 09/06/19 1149     (approximate)  I have reviewed the triage vital signs and the nursing notes.   HISTORY  Chief Complaint Neck Pain    HPI Shari Malone is a 69 y.o. female presents emergency department stating she has a "crick in my neck ".  States it hurts her to turn her neck to the left and the right.  Symptoms for 3 days.  No numbness or tingling.  No known injury.  No fever or chills.   Pain is 3/10   Past Medical History:  Diagnosis Date  . Arthritis   . Asthma   . Diabetes mellitus without complication ([place])   . Glaucoma   . Gout   . Hypertension   . Renal disorder   . Thyroid disease     There are no problems to display for this patient.   Past Surgical History:  Procedure Laterality Date  . CHOLECYSTECTOMY    . MINOR CARPAL TUNNEL    . TUBAL LIGATION      Prior to Admission medications   Medication Sig Start Date End Date Taking? Authorizing Provider  aspirin 81 MG chewable tablet Chew 81 mg by mouth daily.    [provider]  atorvastatin (LIPITOR) 20 MG tablet Take 20 mg by mouth daily.    [provider]  carvedilol (COREG) 6.25 MG tablet Take 6.25 mg by mouth 2 (two) times daily with a meal.    [provider]  chlorthalidone (HYGROTON) 25 MG tablet Take 25 mg by mouth daily.    [provider]  cyclobenzaprine (FLEXERIL) 10 MG tablet Take 1 tablet (10 mg total) by mouth 3 (three) times daily as needed. 09/06/19   Jacody Beneke, Linden Dolin, PA-C  glipiZIDE (GLUCOTROL) 10 MG tablet Take 10 mg by mouth daily before breakfast.    [provider]  levothyroxine (SYNTHROID, LEVOTHROID) 112 MCG tablet Take 112 mcg by mouth daily before breakfast.    [provider]  meloxicam (MOBIC) 15 MG tablet Take 1 tablet (15 mg total) by mouth daily. 09/06/19 09/05/20   Caryn Section Linden Dolin, PA-C  colchicine 0.6 MG tablet Take 1 tablet (0.6 mg total) by mouth 2 (two) times daily. 06/08/16 09/06/19  Johnn Hai, PA-C    Allergies Amoxicillin and Penicillins  No family history on file.  Social History Social History   Tobacco Use  . Smoking status: Never Smoker  . Smokeless tobacco: Never Used  Substance Use Topics  . Alcohol use: No  . Drug use: No    Review of Systems  Constitutional: No fever/chills Eyes: No visual changes. ENT: No sore throat. Respiratory: Denies cough Cardiovascular: Denies chest pain Gastrointestinal: Denies abdominal pain Genitourinary: Negative for dysuria. Musculoskeletal: Negative for back pain.  Positive neck pain Skin: Negative for rash. Psychiatric: no mood changes,     ____________________________________________   PHYSICAL EXAM:  VITAL SIGNS: ED Triage Vitals  Enc Vitals Group     BP 09/06/19 1155 106/61     Pulse Rate 09/06/19 1155 78     Resp 09/06/19 1155 18     Temp 09/06/19 1155 98 F (36.7 C)     Temp Source 09/06/19 1155 Oral     SpO2 09/06/19 1155 98 %     Weight 09/06/19 1051 173 lb (78.5 kg)     Height 09/06/19 1051 4\' 11"  (1.499 m)  Head Circumference --      Peak Flow --      Pain Score 09/06/19 1051 3     Pain Loc --      Pain Edu? --      Excl. in [place]? --     Constitutional: Alert and oriented. Well appearing and in no acute distress. Eyes: Conjunctivae are normal.  Head: Atraumatic. Nose: No congestion/rhinnorhea. Mouth/Throat: Mucous membranes are moist.   Neck:  supple no lymphadenopathy noted Cardiovascular: Normal rate, regular rhythm. Heart sounds are normal Respiratory: Normal respiratory effort.  No retractions, lungs c t a  GU: deferred Musculoskeletal: FROM all extremities, warm and well perfused, decreased range of motion of C-spine, patient has difficulty with hyperextension and rotation to the left.  Grips are equal bilaterally.  Neurovascular is  intact Neurologic:  Normal speech and language.  Skin:  Skin is warm, dry and intact. No rash noted. Psychiatric: Mood and affect are normal. Speech and behavior are normal.  ____________________________________________   LABS (all labs ordered are listed, but only abnormal results are displayed)  Labs Reviewed - No data to display ____________________________________________   ____________________________________________  RADIOLOGY    ____________________________________________   PROCEDURES  Procedure(s) performed: No  Procedures    ____________________________________________   INITIAL IMPRESSION / ASSESSMENT AND PLAN / ED COURSE  Pertinent labs & imaging results that were available during my care of the patient were reviewed by me and considered in my medical decision making (see chart for details).   Patient 69 year old female presents emergency department complaining of neck pain.  Physical exam is consistent with torticollis.  Did explain findings to the patient.  She was given a prescription for meloxicam and Flexeril.  She is apply wet heat to loosen the muscles.  Return emergency department worsening.  States she understands.  Is discharged stable condition.    Shari Malone was evaluated in Emergency Department on 09/06/2019 for the symptoms described in the history of present illness. She was evaluated in the context of the global COVID-19 pandemic, which necessitated consideration that the patient might be at risk for infection with the SARS-CoV-2 virus that causes COVID-19. Institutional protocols and algorithms that pertain to the evaluation of patients at risk for COVID-19 are in a state of rapid change based on information released by regulatory bodies including the CDC and federal and state organizations. These policies and algorithms were followed during the patient's care in the ED.   As part of my medical decision making, I reviewed the following  data within the electronic MEDICAL RECORD NUMBER Nursing notes reviewed and incorporated, Old chart reviewed, Notes from prior ED visits and [place] Controlled Substance Database  ____________________________________________   FINAL CLINICAL IMPRESSION(S) / ED DIAGNOSES  Final diagnoses:  Torticollis      NEW MEDICATIONS STARTED DURING THIS VISIT:  Discharge Medication List as of 09/06/2019 12:04 PM    START taking these medications   Details  meloxicam (MOBIC) 15 MG tablet Take 1 tablet (15 mg total) by mouth daily., Starting Wed 09/06/2019, Until Thu 09/05/2020, Normal         Note:  This document was prepared using Dragon voice recognition software and may include unintentional dictation errors.    Faythe Ghee, PA-C 09/06/19 1349    Concha Se, MD 09/07/19 [phone]

## 2019-09-06 NOTE — Discharge Instructions (Addendum)
See regular doctor if not improving in 2 to 3 days.  Return emergency department worsening.  Use medication as prescribed.  Also apply wet heat to the neck.  Make sure it is not so hot that it burns her skin.

## 2019-09-06 NOTE — ED Triage Notes (Signed)
Patient presents to the ED with neck pain and stiffness x 3 days.  Patient denies any other symptoms.

## 2019-09-06 NOTE — ED Notes (Signed)
See triage note  Presents with pain and stiffness to neck  States started couple of days ago  Denies any injury

## 2020-02-12 DIAGNOSIS — N1831 Chronic kidney disease, stage 3a: Secondary | ICD-10-CM | POA: Insufficient documentation

## 2020-02-12 DIAGNOSIS — H269 Unspecified cataract: Secondary | ICD-10-CM | POA: Insufficient documentation

## 2020-03-31 DIAGNOSIS — N8502 Endometrial intraepithelial neoplasia [EIN]: Secondary | ICD-10-CM | POA: Insufficient documentation

## 2020-04-26 DIAGNOSIS — G4733 Obstructive sleep apnea (adult) (pediatric): Secondary | ICD-10-CM | POA: Insufficient documentation

## 2020-04-26 DIAGNOSIS — R600 Localized edema: Secondary | ICD-10-CM | POA: Insufficient documentation

## 2020-05-29 DIAGNOSIS — R911 Solitary pulmonary nodule: Secondary | ICD-10-CM | POA: Insufficient documentation

## 2020-05-29 DIAGNOSIS — IMO0001 Reserved for inherently not codable concepts without codable children: Secondary | ICD-10-CM | POA: Insufficient documentation

## 2020-06-06 ENCOUNTER — Ambulatory Visit (INDEPENDENT_AMBULATORY_CARE_PROVIDER_SITE_OTHER): Payer: Medicare Other | Admitting: Podiatry

## 2020-06-06 ENCOUNTER — Other Ambulatory Visit: Payer: Self-pay

## 2020-06-06 ENCOUNTER — Encounter: Payer: Self-pay | Admitting: Podiatry

## 2020-06-06 DIAGNOSIS — E1159 Type 2 diabetes mellitus with other circulatory complications: Secondary | ICD-10-CM

## 2020-06-06 DIAGNOSIS — B351 Tinea unguium: Secondary | ICD-10-CM

## 2020-06-06 DIAGNOSIS — M79675 Pain in left toe(s): Secondary | ICD-10-CM

## 2020-06-06 DIAGNOSIS — M79674 Pain in right toe(s): Secondary | ICD-10-CM | POA: Diagnosis not present

## 2020-06-06 NOTE — Progress Notes (Signed)
This patient returns to my office for at risk foot care.  This patient requires this care by a professional since this patient will be at risk due to having diabetes.  This patient is unable to cut nails herself since the patient cannot reach her nails.These nails are painful walking and wearing shoes.  This patient presents for at risk foot care today.  General Appearance  Alert, conversant and in no acute stress.  Vascular  Dorsalis pedis and posterior tibial  pulses are not  palpable  Left foot.  Dorsalis pedis and posterior tibial pulses  are weakly palpable..  Capillary return is within normal limits  bilaterally. Temperature is within normal limits  bilaterally.  Neurologic  Senn-Weinstein monofilament wire test within normal limits  bilaterally. Muscle power within normal limits bilaterally.  Nails Thick disfigured discolored nails with subungual debris  from hallux to fifth toes bilaterally. No evidence of bacterial infection or drainage bilaterally.  Orthopedic  No limitations of motion  feet .  No crepitus or effusions noted.  No bony pathology or digital deformities noted.  Skin  normotropic skin with no porokeratosis noted bilaterally.  No signs of infections or ulcers noted.     Onychomycosis  Pain in right toes  Pain in left toes  Consent was obtained for treatment procedures.   Mechanical debridement of nails 1-5  bilaterally performed with a nail nipper.  Filed with dremel without incident.    Return office visit  3 months                   Told patient to return for periodic foot care and evaluation due to potential at risk complications.   Helane Gunther DPM

## 2020-10-07 ENCOUNTER — Ambulatory Visit: Payer: Medicare Other | Admitting: Podiatry

## 2020-10-17 ENCOUNTER — Ambulatory Visit: Payer: Medicare Other | Admitting: Podiatry

## 2020-12-23 ENCOUNTER — Other Ambulatory Visit: Payer: Self-pay

## 2020-12-23 ENCOUNTER — Encounter (INDEPENDENT_AMBULATORY_CARE_PROVIDER_SITE_OTHER): Payer: Self-pay

## 2020-12-23 ENCOUNTER — Ambulatory Visit (INDEPENDENT_AMBULATORY_CARE_PROVIDER_SITE_OTHER): Payer: Medicare Other | Admitting: Podiatry

## 2020-12-23 ENCOUNTER — Encounter: Payer: Self-pay | Admitting: Podiatry

## 2020-12-23 DIAGNOSIS — E1159 Type 2 diabetes mellitus with other circulatory complications: Secondary | ICD-10-CM

## 2020-12-23 DIAGNOSIS — M79675 Pain in left toe(s): Secondary | ICD-10-CM

## 2020-12-23 DIAGNOSIS — B351 Tinea unguium: Secondary | ICD-10-CM

## 2020-12-23 DIAGNOSIS — N1831 Chronic kidney disease, stage 3a: Secondary | ICD-10-CM

## 2020-12-23 DIAGNOSIS — M79674 Pain in right toe(s): Secondary | ICD-10-CM

## 2020-12-23 NOTE — Progress Notes (Signed)
This patient returns to my office for at risk foot care.  This patient requires this care by a professional since this patient will be at risk due to having diabetes and kidney disease..  This patient is unable to cut nails herself since the patient cannot reach her nails.These nails are painful walking and wearing shoes.  This patient presents for at risk foot care today.  General Appearance  Alert, conversant and in no acute stress.  Vascular  Dorsalis pedis and posterior tibial  pulses are not  palpable  Left foot.  Dorsalis pedis and posterior tibial pulses  are weakly palpable..  Capillary return is within normal limits  bilaterally. Temperature is within normal limits  bilaterally.  Neurologic  Senn-Weinstein monofilament wire test within normal limits  bilaterally. Muscle power within normal limits bilaterally.  Nails Thick disfigured discolored nails with subungual debris  from hallux to fifth toes bilaterally. No evidence of bacterial infection or drainage bilaterally.  Orthopedic  No limitations of motion  feet .  No crepitus or effusions noted.  No bony pathology or digital deformities noted.  Skin  normotropic skin with no porokeratosis noted bilaterally.  No signs of infections or ulcers noted.     Onychomycosis  Pain in right toes  Pain in left toes  Consent was obtained for treatment procedures.   Mechanical debridement of nails 1-5  bilaterally performed with a nail nipper.  Filed with dremel without incident.    Return office visit  3 months                   Told patient to return for periodic foot care and evaluation due to potential at risk complications.   Helane Gunther DPM

## 2021-03-10 ENCOUNTER — Emergency Department
Admission: EM | Admit: 2021-03-10 | Discharge: 2021-03-10 | Disposition: A | Discharge status: Home / Self Care | Payer: Medicare Other | Attending: Emergency Medicine | Admitting: Emergency Medicine

## 2021-03-10 ENCOUNTER — Other Ambulatory Visit: Payer: Self-pay

## 2021-03-10 DIAGNOSIS — I129 Hypertensive chronic kidney disease with stage 1 through stage 4 chronic kidney disease, or unspecified chronic kidney disease: Secondary | ICD-10-CM | POA: Diagnosis not present

## 2021-03-10 DIAGNOSIS — Z79899 Other long term (current) drug therapy: Secondary | ICD-10-CM | POA: Insufficient documentation

## 2021-03-10 DIAGNOSIS — Z7982 Long term (current) use of aspirin: Secondary | ICD-10-CM | POA: Insufficient documentation

## 2021-03-10 DIAGNOSIS — N1831 Chronic kidney disease, stage 3a: Secondary | ICD-10-CM | POA: Insufficient documentation

## 2021-03-10 DIAGNOSIS — Z7984 Long term (current) use of oral hypoglycemic drugs: Secondary | ICD-10-CM | POA: Insufficient documentation

## 2021-03-10 DIAGNOSIS — L02512 Cutaneous abscess of left hand: Secondary | ICD-10-CM | POA: Insufficient documentation

## 2021-03-10 DIAGNOSIS — J45909 Unspecified asthma, uncomplicated: Secondary | ICD-10-CM | POA: Diagnosis not present

## 2021-03-10 DIAGNOSIS — E039 Hypothyroidism, unspecified: Secondary | ICD-10-CM | POA: Diagnosis not present

## 2021-03-10 DIAGNOSIS — E1122 Type 2 diabetes mellitus with diabetic chronic kidney disease: Secondary | ICD-10-CM | POA: Diagnosis not present

## 2021-03-10 DIAGNOSIS — M109 Gout, unspecified: Secondary | ICD-10-CM | POA: Diagnosis not present

## 2021-03-10 DIAGNOSIS — M1A0490 Idiopathic chronic gout, unspecified hand, without tophus (tophi): Secondary | ICD-10-CM

## 2021-03-10 DIAGNOSIS — L0291 Cutaneous abscess, unspecified: Secondary | ICD-10-CM

## 2021-03-10 LAB — BASIC METABOLIC PANEL
Anion gap: 7 (ref 5–15)
BUN: 31 mg/dL — ABNORMAL HIGH (ref 8–23)
CO2: 21 mmol/L — ABNORMAL LOW (ref 22–32)
Calcium: 10 mg/dL (ref 8.9–10.3)
Chloride: 106 mmol/L (ref 98–111)
Creatinine, Ser: 1.72 mg/dL — ABNORMAL HIGH (ref 0.44–1.00)
GFR, Estimated: 32 mL/min — ABNORMAL LOW (ref >60)
Glucose, Bld: 294 mg/dL — ABNORMAL HIGH (ref 70–99)
Potassium: 4.9 mmol/L (ref 3.5–5.1)
Sodium: 134 mmol/L — ABNORMAL LOW (ref 135–145)

## 2021-03-10 LAB — URIC ACID: Uric Acid, Serum: 6.5 mg/dL (ref 2.5–7.1)

## 2021-03-10 LAB — CBC
HCT: 35.8 % — ABNORMAL LOW (ref 36.0–46.0)
Hemoglobin: 11.7 g/dL — ABNORMAL LOW (ref 12.0–15.0)
MCH: 30.6 pg (ref 26.0–34.0)
MCHC: 32.7 g/dL (ref 30.0–36.0)
MCV: 93.7 fL (ref 80.0–100.0)
Platelets: 315 10*3/uL (ref 150–400)
RBC: 3.82 MIL/uL — ABNORMAL LOW (ref 3.87–5.11)
RDW: 13 % (ref 11.5–15.5)
WBC: 10.5 10*3/uL (ref 4.0–10.5)
nRBC: 0 % (ref 0.0–0.2)

## 2021-03-10 MED ORDER — CEPHALEXIN 500 MG PO CAPS: 500.0000 mg | ORAL_CAPSULE | Freq: Two times a day (BID) | ORAL | 0 refills | Status: DC

## 2021-03-10 NOTE — ED Provider Notes (Signed)
[facility] Emergency Department Provider Note   ____________________________________________    I have reviewed the triage vital signs and the nursing notes.   HISTORY  Chief Complaint Gout     HPI Shari Malone is a 70 y.o. female with history of chronic tophus gout who presents with reports of blister to left pinky finger. She noticed it recently, says it is sore, no fevers or chills, no spreading redness. Has not taken anything for this.   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   Intertrigo 05/24/2018   Encounter for long-term (current) use of high-risk medication 10/02/2016   Mild stress incontinence 03/17/2016   Postmenopausal bleeding 08/01/2014   Glaucoma 11/18/2005   Nephritis and nephropathy, 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 Authorizing Provider  cephALEXin (KEFLEX) 500 MG capsule Take 1 capsule (500 mg total) by mouth 2 (two)  times daily. 03/10/21  Yes Jene Every, MD  acetaminophen (TYLENOL) 325 MG tablet Take 650 mg by mouth every 6 (six) hours as needed. 06/21/20   [provider]  albuterol (VENTOLIN HFA) 108 (90 Base) MCG/ACT inhaler Take 2 puffs by mouth every 4 (four) hours as needed. 05/20/16   [provider]  allopurinol (ZYLOPRIM) 100 MG tablet Take by mouth. 08/15/20   [provider]  allopurinol (ZYLOPRIM) 300 MG tablet Take 1 tablet by mouth daily. 05/30/20 05/30/21  [provider]  amLODipine (NORVASC) 10 MG tablet Take 1 tablet by mouth daily. 04/25/20   [provider]  aspirin 81 MG chewable tablet Chew 81 mg by mouth daily.    [provider]  atorvastatin (LIPITOR) 20 MG tablet Take 20 mg by mouth daily.    [provider]  chlorthalidone (HYGROTON) 25 MG tablet Take 25 mg by mouth daily.    [provider]  colchicine 0.6 MG tablet Take by mouth. 09/26/20   [provider]  [employer] SENNA 8.6 MG tablet Take 1 tablet by mouth daily. 06/21/20   [provider]  cyclobenzaprine (FLEXERIL) 10 MG tablet Take 1 tablet (10 mg total) by mouth 3 (three) times daily as needed. 09/06/19   Fisher, Roselyn Bering, PA-C  docusate sodium (COLACE) 100 MG capsule Take 100 mg by mouth daily. 06/21/20   [provider]  empagliflozin (JARDIANCE) 10 MG TABS tablet Take by mouth. 05/29/20   [provider]  glipiZIDE (GLUCOTROL) 10 MG tablet Take 10 mg by mouth daily before breakfast.    [provider]  glucose blood (PRECISION QID TEST) test strip Use to check blood sugar twice daily - before breakfast and after dinner. ICD-10 E11.9 09/12/15   [provider]  ibuprofen (ADVIL) 600 MG tablet Take 600 mg by mouth every 6 (six) hours as needed. 08/03/20   [provider]  ketoconazole (NIZORAL) 2 % shampoo  04/21/20   [provider]  levothyroxine (SYNTHROID) 125 MCG tablet Take 1 tablet by mouth  daily. 05/29/20   [provider]  metFORMIN (GLUCOPHAGE-XR) 500 MG 24 hr tablet TAKE 2 TABLETS (1,000 MG TOTAL) BY MOUTH TWO (2) TIMES A DAY. 05/29/20   [provider]  metoprolol succinate (TOPROL-XL) 50 MG 24 hr tablet Take 1 tablet by mouth daily. 04/25/20 04/25/21  [provider]  oxyCODONE (OXY IR/ROXICODONE) 5 MG immediate release tablet Take 5 mg by mouth every 4 (four) hours as needed. 06/21/20   [provider]  spironolactone (ALDACTONE) 25 MG tablet Take 1 tablet by mouth daily. 05/29/20 05/29/21  [provider]  telmisartan (MICARDIS) 80 MG tablet Take 1 tablet by mouth daily. 04/25/20 04/25/21  [provider]     Allergies Amoxicillin and Penicillins  No family history on file.  Social History Social History   Tobacco Use   Smoking status: Never   Smokeless tobacco: Never  Substance Use Topics   Alcohol use: No   Drug use: No    Review of Systems  Constitutional: No fever/chills  ENT: No sore throat.   Gastrointestinal: No abdominal pain.  No nausea, no vomiting.   Genitourinary: Negative for dysuria. Musculoskeletal: Negative for back pain. Skin: as above Neurological: Negative for headaches     ____________________________________________   PHYSICAL EXAM:  VITAL SIGNS: ED Triage Vitals  Enc Vitals Group     BP 03/10/21 0958 (!) 171/59     Pulse Rate 03/10/21 0958 63     Resp 03/10/21 0958 18     Temp 03/10/21 0958 98.2 F (36.8 C)     Temp Source 03/10/21 0958 Oral     SpO2 03/10/21 0958 99 %     Weight --      Height --      Head Circumference --      Peak Flow --      Pain Score 03/10/21 0957 7     Pain Loc --      Pain Edu? --      Excl. in [place]? --      Constitutional: Alert and oriented. No acute distress. Pleasant and interactive Eyes: Conjunctivae are normal.  Head: Atraumatic. Nose: No congestion/rhinnorhea. Mouth/Throat: Mucous membranes are moist.   Cardiovascular: Normal  rate, regular rhythm.  Respiratory: Normal respiratory effort.  No retractions. Genitourinary: deferred Musculoskeletal: No lower extremity tenderness nor edema.   Neurologic:  Normal speech and language. No gross focal neurologic deficits are appreciated.   Skin:  Skin is warm, dry and intact. Chronic gouty changes, small blister left 5th finger whitish coloration, no surrounding redness   ____________________________________________   LABS (all labs ordered are listed, but only abnormal results are displayed)  Labs Reviewed  CBC - Abnormal; Notable for the following components:      Result Value   RBC 3.82 (*)    Hemoglobin 11.7 (*)    HCT 35.8 (*)    All other components within normal limits  BASIC METABOLIC PANEL -  Abnormal; Notable for the following components:   Sodium 134 (*)    CO2 21 (*)    Glucose, Bld 294 (*)    BUN 31 (*)    Creatinine, Ser 1.72 (*)    GFR, Estimated 32 (*)    All other components within normal limits  URIC ACID   ____________________________________________  EKG   ____________________________________________  RADIOLOGY   ____________________________________________   PROCEDURES  Procedure(s) performed:yes  .Marland KitchenIncision and Drainage  Date/Time: 03/10/2021 1:21 PM Performed by: Lavonia Drafts, MD Authorized by: Lavonia Drafts, MD   Consent:    Consent obtained:  Verbal   Consent given by:  Patient   Risks discussed:  Bleeding, infection, incomplete drainage and pain   Alternatives discussed:  Alternative treatment, delayed treatment and observation Location:    Type:  Abscess Pre-procedure details:    Skin preparation:  Chlorhexidine with alcohol Sedation:    Sedation type:  None Anesthesia:    Anesthesia method:  None Procedure type:    Complexity:  Complex Procedure details:    Incision types:  Single straight   Incision depth:  Dermal   Drainage amount:  Moderate   Wound treatment:  Wound left open Post-procedure  details:    Procedure completion:  Tolerated well, no immediate complications   Critical Care performed: No ____________________________________________   INITIAL IMPRESSION / ASSESSMENT AND PLAN / ED COURSE  Pertinent labs & imaging results that were available during my care of the patient were reviewed by me and considered in my medical decision making (see chart for details).   Patient with questionable abscess vs gout/tophus formation. Well appearing, afebrile, no cellulitis  I&D performed, thick whitish liquid, not purulent. Likely gout related. Abx prophylaxis given dm, f/u with pcp   ____________________________________________   FINAL CLINICAL IMPRESSION(S) / ED DIAGNOSES  Final diagnoses:  Abscess  Chronic gout of hand, unspecified cause, unspecified laterality      NEW MEDICATIONS STARTED DURING THIS VISIT:  Discharge Medication List as of 03/10/2021  1:06 PM     START taking these medications   Details  cephALEXin (KEFLEX) 500 MG capsule Take 1 capsule (500 mg total) by mouth 2 (two) times daily., Starting Mon 03/10/2021, Normal         Note:  This document was prepared using Dragon voice recognition software and may include unintentional dictation errors.    Lavonia Drafts, MD 03/10/21 1322

## 2021-03-10 NOTE — ED Triage Notes (Signed)
Pt comes with c/o gout in bilateral fingers. Pt states it has been going on for awhile. Pt has noticeable swelling to fingers.  Pt also states blister to left pinky.

## 2021-03-10 NOTE — ED Notes (Signed)
Pt unable to sign for d/c d/t blister. Verbalized understanding of d/c instructions. Denies questions or concerns.

## 2021-03-10 NOTE — ED Notes (Signed)
Pt reports blister to inside of pinky finger for the past few days. Diabetic. Significant swelling noted to fingers on both hands. Reports this is normal

## 2021-03-27 ENCOUNTER — Ambulatory Visit: Payer: Medicare Other | Admitting: Podiatry

## 2021-07-07 ENCOUNTER — Ambulatory Visit: Payer: Medicare Other | Admitting: Podiatry

## 2021-12-20 ENCOUNTER — Other Ambulatory Visit: Payer: Self-pay

## 2021-12-20 ENCOUNTER — Emergency Department: Payer: Medicare Other

## 2021-12-20 ENCOUNTER — Emergency Department
Admission: EM | Admit: 2021-12-20 | Discharge: 2021-12-20 | Disposition: A | Discharge status: Home / Self Care | Payer: Medicare Other | Attending: Emergency Medicine | Admitting: Emergency Medicine

## 2021-12-20 ENCOUNTER — Encounter: Payer: Self-pay | Admitting: Emergency Medicine

## 2021-12-20 DIAGNOSIS — E1122 Type 2 diabetes mellitus with diabetic chronic kidney disease: Secondary | ICD-10-CM | POA: Diagnosis not present

## 2021-12-20 DIAGNOSIS — S4992XA Unspecified injury of left shoulder and upper arm, initial encounter: Secondary | ICD-10-CM

## 2021-12-20 DIAGNOSIS — Z23 Encounter for immunization: Secondary | ICD-10-CM | POA: Diagnosis not present

## 2021-12-20 DIAGNOSIS — Y9389 Activity, other specified: Secondary | ICD-10-CM | POA: Insufficient documentation

## 2021-12-20 DIAGNOSIS — S59912A Unspecified injury of left forearm, initial encounter: Secondary | ICD-10-CM | POA: Diagnosis present

## 2021-12-20 DIAGNOSIS — S50812A Abrasion of left forearm, initial encounter: Secondary | ICD-10-CM

## 2021-12-20 DIAGNOSIS — I129 Hypertensive chronic kidney disease with stage 1 through stage 4 chronic kidney disease, or unspecified chronic kidney disease: Secondary | ICD-10-CM | POA: Diagnosis not present

## 2021-12-20 DIAGNOSIS — W010XXA Fall on same level from slipping, tripping and stumbling without subsequent striking against object, initial encounter: Secondary | ICD-10-CM | POA: Diagnosis not present

## 2021-12-20 DIAGNOSIS — N1831 Chronic kidney disease, stage 3a: Secondary | ICD-10-CM | POA: Diagnosis not present

## 2021-12-20 DIAGNOSIS — E039 Hypothyroidism, unspecified: Secondary | ICD-10-CM | POA: Diagnosis not present

## 2021-12-20 MED ORDER — TETANUS-DIPHTH-ACELL PERTUSSIS 5-2.5-18.5 LF-MCG/0.5 IM SUSY
0.5000 mL | PREFILLED_SYRINGE | Freq: Once | INTRAMUSCULAR | Status: AC
  Administered 2021-12-20: 0.5 mL via INTRAMUSCULAR
  Filled 2021-12-20: qty 0.5

## 2021-12-20 MED ORDER — BACITRACIN ZINC 500 UNIT/GM EX OINT
TOPICAL_OINTMENT | CUTANEOUS | Status: AC
  Administered 2021-12-20: 2
  Filled 2021-12-20: qty 1.8

## 2021-12-20 NOTE — Discharge Instructions (Addendum)
It is possible that you have a small crack in your bone which was discussed with you and you would like to have your arm splinted.  Keep your splint clean and dry.  Please follow-up with orthopedics in 1 week for repeat x-rays and splint removal.  Rest, ice, elevate your arm.  Please return for any new, worsening, or change in symptoms or other concerns.

## 2021-12-20 NOTE — ED Triage Notes (Signed)
Pt reports took her dog out to pee and 2 pitbulls came along and she was trying to get to her dog and she tripped and fell hurting her left arm. Pt with large abrasion noted below left elbow to forearm.

## 2021-12-20 NOTE — ED Provider Notes (Signed)
[facility] Provider Note    Event Date/Time   First MD Initiated Contact with Patient 12/20/21 1021     (approximate)   History   Arm Injury   HPI  Shari Malone is a 71 y.o. female with a past medical history of type 2 diabetes, CKD, hypertension who presents today for evaluation after a trip and fall.  Patient reports that she took her dog out and 2 other dogs came at her and she tripped and fell hurting her left arm.  She denies any bites from the dogs.  She denies any head strike or LOC.  She denies any other injuries.  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   Intertrigo 05/24/2018   Encounter for long-term (current) use of high-risk medication 10/02/2016   Mild stress incontinence 03/17/2016   Postmenopausal bleeding 08/01/2014   Glaucoma 11/18/2005   Nephritis and nephropathy, with pathological lesion in kidney 11/18/2005   Osteoarthritis 11/18/2005   Hypothyroidism 08/18/1995   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 --      Excl. in GC? --     Most recent vital signs: Vitals:   12/20/21 1003 12/20/21 1157  BP: (!) 157/63 (!) 150/60  Pulse: (!) 58 60  Resp: 20 18  Temp: 98.7 F (37.1 C)   SpO2: 100% 100%    Physical Exam Vitals and nursing note reviewed.  Constitutional:      General: Awake and alert. No acute distress.    Appearance: Normal appearance. The patient is overweight.  HENT:     Head: Normocephalic and atraumatic.     Mouth: Mucous membranes are moist.  Eyes:     General: PERRL. Normal EOMs        Right eye: No discharge.        Left eye: No discharge.     Conjunctiva/sclera: Conjunctivae normal.  Cardiovascular:     Rate and Rhythm: Normal rate and regular rhythm.     Pulses: Normal pulses.  Pulmonary:     Effort: Pulmonary effort is normal. No respiratory distress.   Abdominal:     Abdomen is soft. There is no abdominal tenderness. No rebound  or guarding. No distention. Musculoskeletal:        General: No swelling. Normal range of motion.     Cervical back: Normal range of motion and neck supple.  No cervical spine tenderness Left upper extremity: Superficial abrasion to forearm, no active bleeding.  No swelling.  Mild tenderness to palpation at the level of the elbow, though full active and passive range of motion, normal resisted supination and pronation.  Compartments are soft and compressible throughout.  Normal grip strength, normal intrinsic muscle function of the hand.  No tenderness to palpation along the wrist or snuffbox.  Normal radial pulse.  No tenderness to palpation to numerous, shoulder joint line, clavicle or AC joint Skin:    General: Skin is warm and dry.     Capillary Refill: Capillary refill takes less than 2 seconds.     Findings: No rash.  Neurological:     Mental Status: The patient is awake and alert.      ED Results / Procedures / Treatments   Labs (all labs ordered are  listed, but only abnormal results are displayed) Labs Reviewed - No data to display   EKG     RADIOLOGY I independently reviewed and interpreted imaging and agree with radiologists findings.     PROCEDURES:  Critical Care performed:   Procedures   MEDICATIONS ORDERED IN ED: Medications  Tdap (BOOSTRIX) injection 0.5 mL (0.5 mLs Intramuscular Given 12/20/21 1038)  bacitracin 500 UNIT/GM ointment (2 Applications  Given 12/20/21 1033)     IMPRESSION / MDM / ASSESSMENT AND PLAN / ED COURSE  I reviewed the triage vital signs and the nursing notes.   Differential diagnosis includes, but is not limited to, fracture, contusion, abrasion, skin tear.  Patient is awake and alert, hemodynamically stable and afebrile.  She is neurovascularly intact.  She has a large superficial abrasion to her left forearm.  She is normal active and passive range of motion of her elbow, though mild tenderness to palpation at the elbow.  X-ray was obtained and demonstrates an elevated fat pad which may be indicative of an effusion.  I discussed the risks and benefits of splinting for protection in case of occult elbow fracture, and patient would like to do this.  Splint placed by paramedic and RN.  We discussed splint care and the importance of close outpatient follow-up with orthopedics for repeat imaging and splint removal.  We also discussed wound care and return precautions regarding that.  Her wound was cleaned and bandaged in the emergency department, and subsequently dressed with bacitracin and sterile gauze.  We discussed return precautions.  No other injury sustained.  No head strike or LOC, no headache or neurological deficits.  No indication for CT per [place] criteria.  Patient was discharged in stable condition.   Patient's presentation is most consistent with acute complicated illness / injury requiring diagnostic workup.       FINAL CLINICAL IMPRESSION(S) / ED DIAGNOSES   Final diagnoses:   Injury of left upper extremity, initial encounter  Abrasion of left forearm, initial encounter     Rx / DC Orders   ED Discharge Orders     None        Note:  This document was prepared using Dragon voice recognition software and may include unintentional dictation errors.   Jackelyn Hoehn, PA-C 12/20/21 1217    Sharyn Creamer, MD 12/20/21 [phone]

## 2022-02-10 ENCOUNTER — Other Ambulatory Visit: Payer: Self-pay

## 2022-02-10 ENCOUNTER — Emergency Department
Admission: EM | Admit: 2022-02-10 | Discharge: 2022-02-10 | Disposition: A | Discharge status: Home / Self Care | Payer: Medicare Other | Attending: Emergency Medicine | Admitting: Emergency Medicine

## 2022-02-10 DIAGNOSIS — M436 Torticollis: Secondary | ICD-10-CM | POA: Diagnosis not present

## 2022-02-10 DIAGNOSIS — M542 Cervicalgia: Secondary | ICD-10-CM | POA: Diagnosis present

## 2022-02-10 MED ORDER — CYCLOBENZAPRINE HCL 5 MG PO TABS: 5.0000 mg | ORAL_TABLET | Freq: Three times a day (TID) | ORAL | 0 refills | Status: DC | PRN

## 2022-02-10 NOTE — ED Triage Notes (Signed)
Pt to ED via POV from home. Pt reports muscle spasm on right side of neck and left side ear ache since Sunday.

## 2022-02-10 NOTE — ED Provider Triage Note (Signed)
Emergency Medicine Provider Triage Evaluation Note  Shari Malone , a 71 y.o. female  was evaluated in triage.  Pt complains of right side neck pain for the past few days. No specific injury.  Physical Exam  There were no vitals taken for this visit. Gen:   Awake, no distress   Resp:  Normal effort  MSK:   Moves extremities without difficulty  Other:    Medical Decision Making  Medically screening exam initiated at 10:16 AM.  Appropriate orders placed.  Shari Malone was informed that the remainder of the evaluation will be completed by another provider, this initial triage assessment does not replace that evaluation, and the importance of remaining in the ED until their evaluation is complete.    Victorino Dike, FNP 02/10/22 1017

## 2022-02-18 NOTE — ED Provider Notes (Signed)
   [facility] Provider Note    Event Date/Time   First MD Initiated Contact with Patient 02/10/22 1119     (approximate)   History   Neck Pain   HPI  Shari Malone is a 71 y.o. female who presents with complaints of right neck pain.  She reports it is painful to turn her head.  She reports she woke up with this yesterday morning.  No trauma.  Or chills.  No intraoral swelling or discomfort     Physical Exam   Triage Vital Signs: ED Triage Vitals  Enc Vitals Group     BP 02/10/22 1017 (!) 179/51     Pulse Rate 02/10/22 1017 (!) 56     Resp 02/10/22 1017 18     Temp 02/10/22 1017 98.5 F (36.9 C)     Temp Source 02/10/22 1017 Oral     SpO2 02/10/22 1017 95 %     Weight 02/10/22 1133 78.5 kg (173 lb 1 oz)     Height 02/10/22 1133 1.499 m (4\' 11" )     Head Circumference --      Peak Flow --      Pain Score 02/10/22 1016 5     Pain Loc --      Pain Edu? --      Excl. in GC? --     Most recent vital signs: Vitals:   02/10/22 1017  BP: (!) 179/51  Pulse: (!) 56  Resp: 18  Temp: 98.5 F (36.9 C)  SpO2: 95%     General: Awake, no distress.  CV:  Good peripheral perfusion.  Resp:  Normal effort.  Abd:  No distention.  Other:  Tenderness to palpation over the right paraspinal cervical spine, muscle spasm palpable, elicits tenderness to touch   ED Results / Procedures / Treatments   Labs (all labs ordered are listed, but only abnormal results are displayed) Labs Reviewed - No data to display   EKG     RADIOLOGY     PROCEDURES:  Critical Care performed:   Procedures   MEDICATIONS ORDERED IN ED: Medications - No data to display   IMPRESSION / MDM / ASSESSMENT AND PLAN / ED COURSE  I reviewed the triage vital signs and the nursing notes. Patient's presentation is most consistent with acute, uncomplicated illness.   Patient presents with neck pain as detailed above.  Differential includes cervical sprain, muscle  spasm, torticollis  No palpable lymph nodes or evidence of infection, no bruit.  Exam is most consistent with torticollis, will trial Flexeril, recommend supportive care, outpatient follow-up       FINAL CLINICAL IMPRESSION(S) / ED DIAGNOSES   Final diagnoses:  Torticollis, acute     Rx / DC Orders   ED Discharge Orders          Ordered    cyclobenzaprine (FLEXERIL) 5 MG tablet  3 times daily PRN        02/10/22 1148             Note:  This document was prepared using Dragon voice recognition software and may include unintentional dictation errors.   02/12/22, MD 02/18/22 1154

## 2022-03-08 ENCOUNTER — Emergency Department
Admission: EM | Admit: 2022-03-08 | Discharge: 2022-03-08 | Disposition: A | Discharge status: Home / Self Care | Payer: Medicare Other | Attending: Emergency Medicine | Admitting: Emergency Medicine

## 2022-03-08 ENCOUNTER — Emergency Department: Payer: Medicare Other

## 2022-03-08 ENCOUNTER — Other Ambulatory Visit: Payer: Self-pay

## 2022-03-08 DIAGNOSIS — E039 Hypothyroidism, unspecified: Secondary | ICD-10-CM | POA: Insufficient documentation

## 2022-03-08 DIAGNOSIS — M25571 Pain in right ankle and joints of right foot: Secondary | ICD-10-CM | POA: Diagnosis present

## 2022-03-08 DIAGNOSIS — M10071 Idiopathic gout, right ankle and foot: Secondary | ICD-10-CM | POA: Diagnosis not present

## 2022-03-08 DIAGNOSIS — E119 Type 2 diabetes mellitus without complications: Secondary | ICD-10-CM | POA: Diagnosis not present

## 2022-03-08 DIAGNOSIS — N189 Chronic kidney disease, unspecified: Secondary | ICD-10-CM | POA: Diagnosis not present

## 2022-03-08 DIAGNOSIS — I129 Hypertensive chronic kidney disease with stage 1 through stage 4 chronic kidney disease, or unspecified chronic kidney disease: Secondary | ICD-10-CM | POA: Insufficient documentation

## 2022-03-08 LAB — BASIC METABOLIC PANEL
Anion gap: 9 (ref 5–15)
BUN: 38 mg/dL — ABNORMAL HIGH (ref 8–23)
CO2: 20 mmol/L — ABNORMAL LOW (ref 22–32)
Calcium: 10.2 mg/dL (ref 8.9–10.3)
Chloride: 106 mmol/L (ref 98–111)
Creatinine, Ser: 2.44 mg/dL — ABNORMAL HIGH (ref 0.44–1.00)
GFR, Estimated: 21 mL/min — ABNORMAL LOW (ref >60)
Glucose, Bld: 287 mg/dL — ABNORMAL HIGH (ref 70–99)
Potassium: 4.9 mmol/L (ref 3.5–5.1)
Sodium: 135 mmol/L (ref 135–145)

## 2022-03-08 LAB — CBC
HCT: 38.9 % (ref 36.0–46.0)
Hemoglobin: 12.7 g/dL (ref 12.0–15.0)
MCH: 28.7 pg (ref 26.0–34.0)
MCHC: 32.6 g/dL (ref 30.0–36.0)
MCV: 88 fL (ref 80.0–100.0)
Platelets: 463 10*3/uL — ABNORMAL HIGH (ref 150–400)
RBC: 4.42 MIL/uL (ref 3.87–5.11)
RDW: 13.2 % (ref 11.5–15.5)
WBC: 15 10*3/uL — ABNORMAL HIGH (ref 4.0–10.5)
nRBC: 0 % (ref 0.0–0.2)

## 2022-03-08 LAB — URIC ACID: Uric Acid, Serum: 10.3 mg/dL — ABNORMAL HIGH (ref 2.5–7.1)

## 2022-03-08 LAB — SEDIMENTATION RATE: Sed Rate: 78 mm/hr — ABNORMAL HIGH (ref 0–30)

## 2022-03-08 MED ORDER — TRAMADOL HCL 50 MG PO TABS
50.0000 mg | ORAL_TABLET | Freq: Once | ORAL | Status: AC
  Administered 2022-03-08: 50 mg via ORAL
  Filled 2022-03-08: qty 1

## 2022-03-08 MED ORDER — TRAMADOL HCL 50 MG PO TABS: 50.0000 mg | ORAL_TABLET | Freq: Four times a day (QID) | ORAL | 0 refills | Status: DC | PRN

## 2022-03-08 MED ORDER — COLCHICINE 0.6 MG PO TABS
ORAL_TABLET | ORAL | 0 refills | Status: AC
Start: 2022-03-08 — End: ?

## 2022-03-08 MED ORDER — COLCHICINE 0.6 MG PO TABS
1.2000 mg | ORAL_TABLET | Freq: Once | ORAL | Status: AC
  Administered 2022-03-08: 1.2 mg via ORAL
  Filled 2022-03-08: qty 2

## 2022-03-08 NOTE — ED Triage Notes (Signed)
Pt states she has been having pain in her R ankle and R knee since Saturday- pt states she has gout and arthritis, but denies any injury

## 2022-03-08 NOTE — ED Notes (Signed)
Pt states coming in for right leg pain. Swelling noted to BUE and BLE. Pt reports difficulty moving the right ankle.  Pt states history of arthritis and gout.

## 2022-03-08 NOTE — Discharge Instructions (Signed)
Take the prescription medications as directed.  Plenty of fluids to promote better kidney function.  Follow-up with your primary provider or specialist for ongoing treatment.

## 2022-03-08 NOTE — ED Provider Notes (Signed)
[facility] Emergency Department Provider Note     Event Date/Time   First MD Initiated Contact with Patient 03/08/22 1509     (approximate)   History   Ankle Pain   HPI  Shari Malone is a 71 y.o. female with a history of DM, hypothyroidism, gouty arthritis, OSA, HTN, and CKD, presents to the ED for evaluation of acute on chronic right ankle and right knee pain per patient reports it was a Saturday.  Denies any recent injury, trauma, fall.     Physical Exam   Triage Vital Signs: ED Triage Vitals  Enc Vitals Group     BP 03/08/22 1344 (!) 154/61     Pulse Rate 03/08/22 1344 66     Resp 03/08/22 1344 17     Temp 03/08/22 1344 98.7 F (37.1 C)     Temp Source 03/08/22 1344 Oral     SpO2 03/08/22 1344 99 %     Weight 03/08/22 1358 186 lb (84.4 kg)     Height 03/08/22 1358 4\' 11"  (1.499 m)     Head Circumference --      Peak Flow --      Pain Score 03/08/22 1358 6     Pain Loc --      Pain Edu? --      Excl. in GC? --     Most recent vital signs: Vitals:   03/08/22 1344 03/08/22 1559  BP: (!) 154/61 (!) 165/68  Pulse: 66 60  Resp: 17 18  Temp: 98.7 F (37.1 C) 97.7 F (36.5 C)  SpO2: 99% 99%    General Awake, no distress. NAD CV:  Good peripheral perfusion.  RESP:  Normal effort.  ABD:  No distention.  MSK:  Tender to palpation to the right knee and right ankle.  Patient with tophaceous changes to the PIP joints of the hands bilaterally.   ED Results / Procedures / Treatments   Labs (all labs ordered are listed, but only abnormal results are displayed) Labs Reviewed  CBC - Abnormal; Notable for the following components:      Result Value   WBC 15.0 (*)    Platelets 463 (*)    All other components within normal limits  URIC ACID - Abnormal; Notable for the following components:   Uric Acid, Serum 10.3 (*)    All other components within normal limits  SEDIMENTATION RATE - Abnormal; Notable for the following components:    Sed Rate 78 (*)    All other components within normal limits  BASIC METABOLIC PANEL - Abnormal; Notable for the following components:   CO2 20 (*)    Glucose, Bld 287 (*)    BUN 38 (*)    Creatinine, Ser 2.44 (*)    GFR, Estimated 21 (*)    All other components within normal limits    EKG   RADIOLOGY  I personally viewed and evaluated these images as part of my medical decision making, as well as reviewing the written report by the radiologist.  ED Provider Interpretation: no acute findings  DG Ankle Complete Right  Result Date: 03/08/2022 CLINICAL DATA:  Ankle and knee pain. EXAM: RIGHT ANKLE - COMPLETE 3+ VIEW COMPARISON:  None Available. FINDINGS: No evidence for an acute fracture. No subluxation or dislocation. Degenerative changes are seen in tibiotalar joint and in the midfoot. IMPRESSION: 1. No acute bony abnormality. Degenerative changes in the ankle and midfoot. Dedicated foot x-rays could be used to  further evaluate as clinically warranted. Electronically Signed   By: Kennith Center M.D.   On: 03/08/2022 13:37   DG Knee Complete 4 Views Right  Result Date: 03/08/2022 CLINICAL DATA:  Knee pain.  History of gout. EXAM: RIGHT KNEE - COMPLETE 4+ VIEW COMPARISON:  None Available. FINDINGS: Small suprapatellar joint effusion. Large marginal erosion is identified along the lateral femoral condyle measuring 1.4 cm. There is moderate to severe tricompartment osteoarthritis which appears most of advanced within the lateral compartment. Vascular calcifications noted. IMPRESSION: 1. Large marginal erosion along the lateral femoral condyle compatible with gout. 2. Moderate to severe tricompartment osteoarthritis. 3. Small suprapatellar joint effusion. Electronically Signed   By: Signa Kell M.D.   On: 03/08/2022 13:37     PROCEDURES:  Critical Care performed: No  Procedures   MEDICATIONS ORDERED IN ED: Medications  traMADol (ULTRAM) tablet 50 mg (50 mg Oral Given 03/08/22  1554)  colchicine tablet 1.2 mg (1.2 mg Oral Given 03/08/22 1553)     IMPRESSION / MDM / ASSESSMENT AND PLAN / ED COURSE  I reviewed the triage vital signs and the nursing notes.                              Differential diagnosis includes, but is not limited to, gout, OA, sprain, fracture  Patient's presentation is most consistent with acute complicated illness / injury requiring diagnostic workup.  Geriatric patient to the ED for evaluation of acute joint pain to the right knee and right ankle, consistent with a history of gouty arthritis.  Patient with elevated uric acid and sed rate on evaluation, with acute and exquisite pain consistent with gouty arthritis flare. Patient will be discharged home with prescriptions for colchicine and Ultram. Patient is to follow up with her primary provider as needed or otherwise directed. Patient is given ED precautions to return to the ED for any worsening or new symptoms.     FINAL CLINICAL IMPRESSION(S) / ED DIAGNOSES   Final diagnoses:  Acute idiopathic gout of right ankle     Rx / DC Orders   ED Discharge Orders          Ordered    traMADol (ULTRAM) 50 MG tablet  Every 6 hours PRN        03/08/22 1551    colchicine 0.6 MG tablet        03/08/22 1551             Note:  This document was prepared using Dragon voice recognition software and may include unintentional dictation errors.    Lissa Hoard, PA-C 03/08/22 2309    Sharman Cheek, MD 03/11/22 [phone]

## 2022-03-08 NOTE — ED Provider Triage Note (Signed)
  Emergency Medicine Provider Triage Evaluation Note  Shari Malone , a 71 y.o.female,  was evaluated in triage.  Pt complains of knee and ankle pain.  Patient states that her right knee and right ankle have been hurting for the past week.  Denies any recent falls or injuries.  She states that she is having a hard time moving it.  She states that she has been unable to walk on it due to the pain.  She states that the pain is predominantly along the lateral aspect of her knee and diffusely around the ankle.  She does have a history of gout.   Review of Systems  Positive: Knee pain, ankle pain Negative: Denies fever, chest pain, vomiting  Physical Exam  There were no vitals filed for this visit. Gen:   Awake, no distress   Resp:  Normal effort  MSK:   Moves extremities without difficulty  Other:    Medical Decision Making  Given the patient's initial medical screening exam, the following diagnostic evaluation has been ordered. The patient will be placed in the appropriate treatment space, once one is available, to complete the evaluation and treatment. I have discussed the plan of care with the patient and I have advised the patient that an ED physician or mid-level practitioner will reevaluate their condition after the test results have been received, as the results may give them additional insight into the type of treatment they may need.    Diagnostics: Knee x-ray, ankle x-ray, labs  Treatments: none immediately   Varney Daily, [place] 03/08/22 1251

## 2022-10-03 ENCOUNTER — Other Ambulatory Visit: Payer: Self-pay

## 2022-10-03 ENCOUNTER — Encounter: Payer: Self-pay | Admitting: Radiology

## 2022-10-03 ENCOUNTER — Emergency Department
Admission: EM | Admit: 2022-10-03 | Discharge: 2022-10-03 | Disposition: A | Discharge status: Home / Self Care | Payer: 59 | Attending: Emergency Medicine | Admitting: Emergency Medicine

## 2022-10-03 DIAGNOSIS — E039 Hypothyroidism, unspecified: Secondary | ICD-10-CM | POA: Diagnosis not present

## 2022-10-03 DIAGNOSIS — I129 Hypertensive chronic kidney disease with stage 1 through stage 4 chronic kidney disease, or unspecified chronic kidney disease: Secondary | ICD-10-CM | POA: Insufficient documentation

## 2022-10-03 DIAGNOSIS — M542 Cervicalgia: Secondary | ICD-10-CM

## 2022-10-03 DIAGNOSIS — E1122 Type 2 diabetes mellitus with diabetic chronic kidney disease: Secondary | ICD-10-CM | POA: Insufficient documentation

## 2022-10-03 DIAGNOSIS — N1831 Chronic kidney disease, stage 3a: Secondary | ICD-10-CM | POA: Diagnosis not present

## 2022-10-03 DIAGNOSIS — J45909 Unspecified asthma, uncomplicated: Secondary | ICD-10-CM | POA: Insufficient documentation

## 2022-10-03 MED ORDER — ACETAMINOPHEN 500 MG PO TABS
1000.0000 mg | ORAL_TABLET | Freq: Once | ORAL | Status: AC
  Administered 2022-10-03: 1000 mg via ORAL
  Filled 2022-10-03: qty 2

## 2022-10-03 MED ORDER — LIDOCAINE 5 % EX PTCH
1.0000 | MEDICATED_PATCH | CUTANEOUS | Status: DC
  Administered 2022-10-03: 1 via TRANSDERMAL
  Filled 2022-10-03: qty 1

## 2022-10-03 MED ORDER — LIDOCAINE 5 % EX PTCH: 1.0000 | MEDICATED_PATCH | CUTANEOUS | 0 refills | Status: DC

## 2022-10-03 NOTE — Discharge Instructions (Signed)
I suspect that your pain is due to muscle spasm and strain.  I recommend taking 1 g of Tylenol every 8 hours.  If you are going to take the Tylenol PM only do this before bed as this will cause oversedation during the day.  Please also use the Lidoderm patch to help with muscle pain and spasm.  I also recommend using a heating pad.  This may take several days to weeks to improve.  If your pain is worsening or you develop any new neurologic symptoms such as numbness or weakness on one side of your body or vision change then please return to the emergency department.

## 2022-10-03 NOTE — ED Provider Notes (Addendum)
[facility] Provider Note    Event Date/Time   First MD Initiated Contact with Patient 10/03/22 1106     (approximate)   History   Neck Pain   HPI  Shari Malone is a 72 y.o. female with past medical history of diabetes, hypertension, asthma, gout, CKD who presents with neck pain.  Symptoms started about 3 days ago after she woke up.  Patient is assuming she slept wrong.  Pain is in the left lateral neck.  It is worse with movement.  Denies any radiation of pain down her arms or numbness tingling in her arms or legs.  No vision change difficulty speaking.  Denies fevers or chills, or URI symptoms.  No chest pain.  Has had similar neck pain in the past.  She has been taking Tylenol PM for it both at night and during the day.  Denies other injury.     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   Intertrigo 05/24/2018   Encounter for long-term (current) use of high-risk medication 10/02/2016   Mild stress incontinence 03/17/2016   Postmenopausal bleeding 08/01/2014   Glaucoma 11/18/2005   Nephritis and nephropathy, with pathological lesion in kidney 11/18/2005   Osteoarthritis 11/18/2005   Hypothyroidism 08/18/1995   Resistant hypertension 02/23/1994   Asthma 12/26/1993      Physical Exam  Triage Vital Signs: ED Triage Vitals  Enc Vitals Group     BP 10/03/22 1059 (!) 168/59     Pulse Rate 10/03/22 1059 71     Resp 10/03/22 1059 16     Temp 10/03/22 1059 97.7 F (36.5 C)     Temp Source 10/03/22 1059 Oral     SpO2 10/03/22 1059 100 %     Weight 10/03/22 1058 184 lb (83.5 kg)     Height 10/03/22 1058 4\' 11"  (1.499 m)     Head Circumference --      Peak Flow --      Pain Score --      Pain Loc --      Pain Edu? --      Excl. in GC? --     Most recent vital signs: Vitals:   10/03/22 1059 10/03/22 1115  BP: (!) 168/59   Pulse: 71 65  Resp: 16   Temp: 97.7 F (36.5 C)   SpO2: 100% 99%     General: Awake, no distress.  CV:  Good peripheral perfusion.  Resp:  Normal effort.  Abd:  No distention.  Neuro:             Awake, Alert, Oriented x 3  Other:  No midline C-spine tenderness, patient is able to fully rotate the neck laterally right and left, mild  tenderness to palpation over the superior anterior portion of the sternocleidomastoid on the left just distal to the mastoid, no mastoid tenderness or erythema Aox3, nml speech  PERRL, EOMI, face symmetric, nml tongue movement  5/5 strength in the BL upper and lower extremities  Sensation grossly intact in the BL upper and lower extremities  Finger-nose-finger intact BL Symmetric 2+ radial pulses bilaterally ED Results / Procedures / Treatments  Labs (all labs ordered are listed, but only abnormal results are displayed) Labs Reviewed - No data to display   EKG     RADIOLOGY    PROCEDURES:  Critical Care performed: No  Procedures   MEDICATIONS ORDERED IN ED: Medications  lidocaine (LIDODERM) 5 % 1 patch (1 patch Transdermal Patch Applied 10/03/22 1124)  acetaminophen (TYLENOL) tablet 1,000 mg (1,000 mg Oral Given 10/03/22 1124)     IMPRESSION / MDM / ASSESSMENT AND PLAN / ED COURSE  I reviewed the triage vital signs and the nursing notes.                               Patient's presentation is most consistent with acute, uncomplicated illness.  Differential diagnosis includes, but is not limited to, musculoskeletal pain, torticollis, cervical artery dissection, discitis or osteomyelitis, cervical radiculopathy, cervical myelopathy, referred pain from the shoulder, low suspicion for deep space infection   The patient is a 72 year old female with multiple medical comorbidities including diabetes hypertension CKD who presents with 3 days of left-sided neck pain.  She thinks she slept wrong on the neck.  Does have have history of similar neck pain in the past she tells me.  Pain is in the left lateral neck.  Does not radiate she has no radicular symptoms including no numbness or weakness.  No other neurologic symptoms including vision changes double vision difficulty speaking or swallowing or focal numbness or weakness.  He has no infectious symptoms.  No chest pain or other ischemic symptoms.  Patient is hypertensive on arrival.  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  Reviewed recent PCP note and they are working on that.  On exam she overall looks well and is not in distress.  She is able to fully range her neck and has no midline tenderness.  She is tender to palpation over the superior anterior portion of the left SCM distal to the left mastoid.  There is no mastoid tenderness or erythema.  Neurologic exam including cranial nerves cerebellar testing strength and sensation are intact.  Overall I suspect that this is musculoskeletal neck pain given it is not midline she is tender over the SCM and she can fully range her neck and has no other neurologic symptoms.  Did consider more serious causes of neck pain given her age and comorbidities such as cervical artery dissection but patient has no headache no neurologic symptoms and had no trauma so I feel that this is less likely.  She is not having fever and is otherwise well so my suspicion for infectious  process is quite low.  She is not having any radicular symptoms to suggest cervical myelopathy or radiculopathy.  At this point we will treat conservatively with Tylenol and Lidoderm patch.  Will need to avoid NSAIDs given her resistant hypertension and CKD.  She has been taking Tylenol PM both during the day and at night.  Counseled her that she should only be taking Tylenol PM at night if at all.  Recommended  she avoid this during the day due to oversedation.  Recommended she take 1 g Tylenol every 8 hours to maximize the effect.  Will prescribe Lidoderm patch.  Given patient has having full range of motion in her neck and is not in a good amount of distress will avoid opiates or muscle relaxants given the side effect profile.  Recommended other conservative measures such as heating pad.  Discussed natural course of musculoskeletal pain and discussed return precautions for any fevers or systemic symptoms or any new neurologic symptoms.  Patient is appropriate for discharge at this time.      FINAL CLINICAL IMPRESSION(S) / ED DIAGNOSES   Final diagnoses:  Neck pain     Rx / DC Orders   ED Discharge Orders          Ordered    lidocaine (LIDODERM) 5 %  Every 24 hours        10/03/22 1126             Note:  This document was prepared using Dragon voice recognition software and may include unintentional dictation errors.   Georga Hacking, MD 10/03/22 1126    Georga Hacking, MD 10/03/22 1128

## 2022-10-03 NOTE — ED Triage Notes (Signed)
Pt states the left side of her neck started hurting 2-3 days ago and believes she pulled a muscle. Denies injury, believes she slept wrong.

## 2022-12-24 ENCOUNTER — Encounter: Payer: Self-pay | Admitting: Ophthalmology

## 2023-01-01 NOTE — Discharge Instructions (Signed)

## 2023-01-04 ENCOUNTER — Other Ambulatory Visit: Payer: Self-pay

## 2023-01-04 ENCOUNTER — Ambulatory Visit: Payer: 59 | Admitting: Anesthesiology

## 2023-01-04 ENCOUNTER — Ambulatory Visit
Admission: RE | Admit: 2023-01-04 | Discharge: 2023-01-04 | Disposition: A | Discharge status: Home / Self Care | Payer: 59 | Attending: Ophthalmology | Admitting: Ophthalmology

## 2023-01-04 ENCOUNTER — Encounter
Admission: RE | Disposition: A | Discharge status: Home / Self Care | Payer: Self-pay | Source: Home / Self Care | Attending: Ophthalmology

## 2023-01-04 ENCOUNTER — Encounter: Payer: Self-pay | Admitting: Ophthalmology

## 2023-01-04 DIAGNOSIS — H2512 Age-related nuclear cataract, left eye: Secondary | ICD-10-CM | POA: Insufficient documentation

## 2023-01-04 DIAGNOSIS — I10 Essential (primary) hypertension: Secondary | ICD-10-CM | POA: Diagnosis not present

## 2023-01-04 DIAGNOSIS — Z87891 Personal history of nicotine dependence: Secondary | ICD-10-CM | POA: Insufficient documentation

## 2023-01-04 DIAGNOSIS — E079 Disorder of thyroid, unspecified: Secondary | ICD-10-CM | POA: Diagnosis not present

## 2023-01-04 DIAGNOSIS — E1136 Type 2 diabetes mellitus with diabetic cataract: Secondary | ICD-10-CM | POA: Insufficient documentation

## 2023-01-04 DIAGNOSIS — G4730 Sleep apnea, unspecified: Secondary | ICD-10-CM | POA: Insufficient documentation

## 2023-01-04 DIAGNOSIS — H409 Unspecified glaucoma: Secondary | ICD-10-CM | POA: Diagnosis not present

## 2023-01-04 DIAGNOSIS — Z7984 Long term (current) use of oral hypoglycemic drugs: Secondary | ICD-10-CM | POA: Diagnosis not present

## 2023-01-04 DIAGNOSIS — M109 Gout, unspecified: Secondary | ICD-10-CM | POA: Insufficient documentation

## 2023-01-04 DIAGNOSIS — J45909 Unspecified asthma, uncomplicated: Secondary | ICD-10-CM | POA: Diagnosis not present

## 2023-01-04 DIAGNOSIS — M1990 Unspecified osteoarthritis, unspecified site: Secondary | ICD-10-CM | POA: Insufficient documentation

## 2023-01-04 HISTORY — DX: Presence of dental prosthetic device (complete) (partial): Z97.2

## 2023-01-04 HISTORY — PX: CATARACT EXTRACTION W/PHACO: SHX586

## 2023-01-04 LAB — GLUCOSE, CAPILLARY: Glucose-Capillary: 194 mg/dL — ABNORMAL HIGH (ref 70–99)

## 2023-01-04 SURGERY — PHACOEMULSIFICATION, CATARACT, WITH IOL INSERTION
Anesthesia: Monitor Anesthesia Care | Site: Eye | Laterality: Left

## 2023-01-04 MED ORDER — ARMC OPHTHALMIC DILATING DROPS
1.0000 | OPHTHALMIC | Status: DC | PRN
  Administered 2023-01-04 (×3): 1 via OPHTHALMIC

## 2023-01-04 MED ORDER — MIDAZOLAM HCL 2 MG/2ML IJ SOLN
INTRAMUSCULAR | Status: DC | PRN
  Administered 2023-01-04: 1 mg via INTRAVENOUS

## 2023-01-04 MED ORDER — SIGHTPATH DOSE#1 BSS IO SOLN
INTRAOCULAR | Status: DC | PRN
  Administered 2023-01-04: 15 mL

## 2023-01-04 MED ORDER — SIGHTPATH DOSE#1 NA HYALUR & NA CHOND-NA HYALUR IO KIT
PACK | INTRAOCULAR | Status: DC | PRN
  Administered 2023-01-04: 1 via OPHTHALMIC

## 2023-01-04 MED ORDER — TETRACAINE HCL 0.5 % OP SOLN
1.0000 [drp] | OPHTHALMIC | Status: DC | PRN
  Administered 2023-01-04 (×3): 1 [drp] via OPHTHALMIC

## 2023-01-04 MED ORDER — TRYPAN BLUE 0.06 % IO SOSY
PREFILLED_SYRINGE | INTRAOCULAR | Status: DC | PRN
Start: 2023-01-04 — End: 2023-01-04
  Administered 2023-01-04: .1 mL via INTRAOCULAR

## 2023-01-04 MED ORDER — FENTANYL CITRATE (PF) 100 MCG/2ML IJ SOLN
INTRAMUSCULAR | Status: DC | PRN
  Administered 2023-01-04 (×4): 25 ug via INTRAVENOUS

## 2023-01-04 MED ORDER — MOXIFLOXACIN HCL 0.5 % OP SOLN
OPHTHALMIC | Status: DC | PRN
  Administered 2023-01-04: .2 mL via OPHTHALMIC

## 2023-01-04 MED ORDER — LIDOCAINE HCL (PF) 2 % IJ SOLN
INTRAOCULAR | Status: DC | PRN
  Administered 2023-01-04: 1 mL via INTRAOCULAR

## 2023-01-04 MED ORDER — SIGHTPATH DOSE#1 BSS IO SOLN
INTRAOCULAR | Status: DC | PRN
  Administered 2023-01-04: 137 mL via OPHTHALMIC

## 2023-01-04 MED ORDER — MIDAZOLAM HCL 2 MG/2ML IJ SOLN
INTRAMUSCULAR | Status: AC
  Filled 2023-01-04: qty 2

## 2023-01-04 MED ORDER — TETRACAINE HCL 0.5 % OP SOLN
OPHTHALMIC | Status: AC
  Filled 2023-01-04: qty 4

## 2023-01-04 MED ORDER — FENTANYL CITRATE (PF) 100 MCG/2ML IJ SOLN
INTRAMUSCULAR | Status: AC
  Filled 2023-01-04: qty 2

## 2023-01-04 SURGICAL SUPPLY — 13 items
CANNULA ANT/CHMB 27G (MISCELLANEOUS) IMPLANT
CANNULA ANT/CHMB 27GA (MISCELLANEOUS) ×1
CATARACT SUITE SIGHTPATH (MISCELLANEOUS) ×1
DISSECTOR HYDRO NUCLEUS 50X22 (MISCELLANEOUS) ×1 IMPLANT
FEE CATARACT SUITE SIGHTPATH (MISCELLANEOUS) ×1 IMPLANT
GLOVE SURG GAMMEX PI TX LF 7.5 (GLOVE) ×1 IMPLANT
GLOVE SURG SYN 8.5 E (GLOVE) ×1
GLOVE SURG SYN 8.5 PF PI (GLOVE) ×1 IMPLANT
LENS IOL TECNIS EYHANCE 18.0 (Intraocular Lens) IMPLANT
NDL FILTER BLUNT 18X1 1/2 (NEEDLE) ×1 IMPLANT
NEEDLE FILTER BLUNT 18X1 1/2 (NEEDLE) ×1
SYR 3ML LL SCALE MARK (SYRINGE) ×1 IMPLANT
SYR 5ML LL (SYRINGE) ×1 IMPLANT

## 2023-01-04 NOTE — H&P (Signed)
[facility]   Primary Care Physician:  [facility] Ophthalmologist: Dr. Willey Blade  Pre-Procedure History & Physical: HPI:  Shari Malone is a 72 y.o. female here for cataract surgery.   Past Medical History:  Diagnosis Date   Arthritis    Asthma    Diabetes mellitus without complication (HCC)    Glaucoma    Gout    Hypertension    Renal disorder    Thyroid disease    Wears dentures    full upper and lower    Past Surgical History:  Procedure Laterality Date   CHOLECYSTECTOMY     MINOR CARPAL TUNNEL     TUBAL LIGATION      Prior to Admission medications   Medication Sig Start Date End Date Taking? Authorizing Provider  acetaminophen (TYLENOL) 325 MG tablet Take 650 mg by mouth every 6 (six) hours as needed. 06/21/20  Yes [provider]  albuterol (VENTOLIN HFA) 108 (90 Base) MCG/ACT inhaler Take 2 puffs by mouth every 4 (four) hours as needed. 05/20/16  Yes [provider]  amLODipine (NORVASC) 10 MG tablet Take 1 tablet by mouth daily. 04/25/20  Yes [provider]  aspirin 81 MG chewable tablet Chew 81 mg by mouth daily.   Yes [provider]  atorvastatin (LIPITOR) 20 MG tablet Take 20 mg by mouth daily.   Yes [provider]  carvedilol (COREG) 3.125 MG tablet Take 3.125 mg by mouth 2 (two) times daily with a meal.   Yes [provider]  chlorthalidone (HYGROTON) 25 MG tablet Take 25 mg by mouth daily.   Yes [provider]  Cholecalciferol 25 MCG (1000 UT) capsule Take 1,000 Units by mouth daily.   Yes [provider]  dapagliflozin propanediol (FARXIGA) 10 MG TABS tablet Take 10 mg by mouth daily.   Yes [provider]  glipiZIDE (GLUCOTROL) 10 MG tablet Take 5 mg by mouth daily before breakfast.   Yes [provider]  ketoconazole (NIZORAL) 2 % shampoo  04/21/20  Yes [provider]  levothyroxine (SYNTHROID) 125 MCG tablet Take 1 tablet by mouth  daily. 05/29/20  Yes [provider]  oxyCODONE (OXY IR/ROXICODONE) 5 MG immediate release tablet Take 5 mg by mouth every 4 (four) hours as needed. 06/21/20  Yes [provider]  spironolactone (ALDACTONE) 25 MG tablet Take 1 tablet by mouth daily. 05/29/20 12/24/22 Yes [provider]  telmisartan (MICARDIS) 80 MG tablet Take 1 tablet by mouth daily. 04/25/20 12/24/22 Yes [provider]  allopurinol (ZYLOPRIM) 100 MG tablet Take by mouth. Patient not taking: Reported on 12/24/2022 08/15/20   [provider]  colchicine 0.6 MG tablet Take 2 tabs PO x 1, then 1 tab PO 1 hour later x 1 Max: 1.8 mg total dose per attack, do not repeat within 3 days. Patient not taking: Reported on 12/24/2022 03/08/22   Menshew, Charlesetta Ivory, PA-C  glucose blood (PRECISION QID TEST) test strip Use to check blood sugar twice daily - before breakfast and after dinner. ICD-10 E11.9 09/12/15   [provider]  ibuprofen (ADVIL) 600 MG tablet Take 600 mg by mouth every 6 (six) hours as needed. Patient not taking: Reported on 01/04/2023 08/03/20   [provider]    Allergies as of 12/16/2022 - Review Complete 10/03/2022  Allergen Reaction Noted   Amoxicillin Rash 01/03/2015   Penicillins Rash 01/03/2015    History reviewed. No pertinent family history.  Social  History   Socioeconomic History   Marital status: Widowed    Spouse name: Not on file   Number of children: Not on file   Years of education: Not on file   Highest education level: Not on file  Occupational History   Not on file  Tobacco Use   Smoking status: Former    Current packs/day: 0.00    Average packs/day: 0.2 packs/day for 13.0 years (2.0 ttl pk-yrs)    Types: Cigarettes    Start date: 51    Quit date: 2    Years since quitting: 39.7   Smokeless tobacco: Never  Vaping Use   Vaping status: Never Used  Substance and Sexual Activity   Alcohol use: No   Drug use: No   Sexual  activity: Not on file  Other Topics Concern   Not on file  Social History Narrative   Not on file   Social Determinants of Health   Financial Resource Strain: Medium Risk (09/25/2022)   Received [facility], [facility]   Overall Financial Resource Strain (CARDIA)    Difficulty of Paying Living Expenses: Somewhat hard  Food Insecurity: No Food Insecurity (09/25/2022)   Received from [facility], [facility]   Hunger Vital Sign    Worried About Running Out of Food in the Last Year: Never true    Ran Out of Food in the Last Year: Never true  Transportation Needs: No Transportation Needs (09/25/2022)   Received from [facility], [facility]   PRAPARE - Transportation    Lack of Transportation (Medical): No    Lack of Transportation (Non-Medical): No  Physical Activity: Insufficiently Active (05/17/2020)   Received from [facility], [facility]   Exercise Vital Sign    Days of Exercise per Week: 3 days    Minutes of Exercise per Session: 40 min  Stress: No Stress Concern Present (05/17/2020)   Received from [facility], [facility] of Occupational Health - Occupational Stress Questionnaire    Feeling of Stress : Not at all  Social Connections: Moderately Isolated (05/17/2020)   Received from [facility], [facility]   Social Connection and Isolation Panel [NHANES]    Frequency of Communication with Friends and Family: More than three times a week    Frequency of Social Gatherings with Friends and Family: More than three times a week    Attends Religious Services: More than 4 times per year    Active Member of [employer] or Organizations: No    Attends Banker Meetings: Never    Marital Status: Widowed  Intimate Partner Violence: Not At Risk (05/17/2020)   Received from [facility], [facility]   Humiliation, Afraid, Rape, and Kick questionnaire    Fear of Current or Ex-Partner: No    Emotionally  Abused: No    Physically Abused: No    Sexually Abused: No    Review of Systems: See HPI, otherwise negative ROS  Physical Exam: BP (!) 157/57   Pulse 69   Temp 98.1 F (36.7 C) (Temporal)   Resp 14   Ht 4\' 11"  (1.499 m)   Wt 79.8 kg   SpO2 100%   BMI 35.55 kg/m  General:   Alert, cooperative in NAD Head:  Normocephalic and atraumatic. Respiratory:  Normal work of breathing. Cardiovascular:  RRR  Impression/Plan: Shari Malone is here for cataract surgery.  Risks,  benefits, limitations, and alternatives regarding cataract surgery have been reviewed with the patient.  Questions have been answered.  All parties agreeable.   Willey Blade, MD  01/04/2023, 7:54 AM

## 2023-01-04 NOTE — Anesthesia Postprocedure Evaluation (Signed)
Anesthesia Post Note  Patient: Shari Malone  Procedure(s) Performed: CATARACT EXTRACTION PHACO AND INTRAOCULAR LENS PLACEMENT (IOC) LEFT DIABETIC VISION BLUE (Left: Eye)  Patient location during evaluation: PACU Anesthesia Type: MAC Level of consciousness: awake and alert Pain management: pain level controlled Vital Signs Assessment: post-procedure vital signs reviewed and stable Respiratory status: spontaneous breathing, nonlabored ventilation, respiratory function stable and patient connected to nasal cannula oxygen Cardiovascular status: stable and blood pressure returned to baseline Postop Assessment: no apparent nausea or vomiting Anesthetic complications: no   No notable events documented.   Last Vitals:  Vitals:   01/04/23 0829 01/04/23 0834  BP: (!) 132/59 (!) 150/62  Pulse: (!) 55 (!) 54  Resp: 18 12  Temp: 36.5 C (!) 36.4 C  SpO2: 100% 99%    Last Pain:  Vitals:   01/04/23 0834  TempSrc:   PainSc: 0-No pain                 Marisue Humble

## 2023-01-04 NOTE — Anesthesia Preprocedure Evaluation (Signed)
Anesthesia Evaluation  Patient identified by MRN, date of birth, ID band Patient awake    Reviewed: Allergy & Precautions, H&P , NPO status , Patient's Chart, lab work & pertinent test results  Airway Mallampati: IV  TM Distance: >3 FB Neck ROM: Full  Mouth opening: Limited Mouth Opening  Dental no notable dental hx. (+) Edentulous Upper, Edentulous Lower   Pulmonary asthma , sleep apnea , former smoker   Pulmonary exam normal breath sounds clear to auscultation       Cardiovascular hypertension, Normal cardiovascular exam Rhythm:Regular Rate:Normal     Neuro/Psych negative neurological ROS  negative psych ROS   GI/Hepatic negative GI ROS, Neg liver ROS,,,  Endo/Other  diabetesHypothyroidism    Renal/GU Renal disease  negative genitourinary   Musculoskeletal  (+) Arthritis ,    Abdominal   Peds negative pediatric ROS (+)  Hematology negative hematology ROS (+)   Anesthesia Other Findings Gout  Hypertension Diabetes mellitus without complication Thyroid disease Glaucoma  Renal disorder Asthma  Arthritis Wears dentures \ Severe gouty arthritis noted    Reproductive/Obstetrics negative OB ROS                              Anesthesia Physical Anesthesia Plan  ASA: 3  Anesthesia Plan: MAC   Post-op Pain Management:    Induction: Intravenous  PONV Risk Score and Plan:   Airway Management Planned: Natural Airway and Nasal Cannula  Additional Equipment:   Intra-op Plan:   Post-operative Plan:   Informed Consent: I have reviewed the patients History and Physical, chart, labs and discussed the procedure including the risks, benefits and alternatives for the proposed anesthesia with the patient or authorized representative who has indicated his/her understanding and acceptance.     Dental Advisory Given  Plan Discussed with: Anesthesiologist, CRNA and Surgeon  Anesthesia  Plan Comments: (Patient consented for risks of anesthesia including but not limited to:  - adverse reactions to medications - damage to eyes, teeth, lips or other oral mucosa - nerve damage due to positioning  - sore throat or hoarseness - Damage to heart, brain, nerves, lungs, other parts of body or loss of life  Patient voiced understanding.)         Anesthesia Quick Evaluation

## 2023-01-04 NOTE — Transfer of Care (Signed)
Immediate Anesthesia Transfer of Care Note  Patient: Shari Malone  Procedure(s) Performed: CATARACT EXTRACTION PHACO AND INTRAOCULAR LENS PLACEMENT (IOC) LEFT DIABETIC VISION BLUE (Left: Eye)  Patient Location: PACU  Anesthesia Type: MAC  Level of Consciousness: awake, alert  and patient cooperative  Airway and Oxygen Therapy: Patient Spontanous Breathing and Patient connected to supplemental oxygen  Post-op Assessment: Post-op Vital signs reviewed, Patient's Cardiovascular Status Stable, Respiratory Function Stable, Patent Airway and No signs of Nausea or vomiting  Post-op Vital Signs: Reviewed and stable  Complications: No notable events documented.

## 2023-01-04 NOTE — Op Note (Signed)
OPERATIVE NOTE  SI FORBES [id number] 01/04/2023   PREOPERATIVE DIAGNOSIS:  Nuclear sclerotic cataract left eye.  H25.12   POSTOPERATIVE DIAGNOSIS:    Nuclear sclerotic cataract left eye.     PROCEDURE:  Phacoemusification with posterior chamber intraocular lens placement of the left eye   LENS:   Implant Name Type Inv. Item Serial No. Manufacturer Lot No. LRB No. Used Action  LENS IOL TECNIS EYHANCE 18.0 - [id number] Intraocular Lens LENS IOL TECNIS EYHANCE 18.0 [id number] [employer]  Left 1 Implanted      Procedure(s) with comments: CATARACT EXTRACTION PHACO AND INTRAOCULAR LENS PLACEMENT (IOC) LEFT DIABETIC VISION BLUE (Left) - 20.73 1:39.1  DIB00 +18.0   ULTRASOUND TIME: 1 minutes 39 seconds.  CDE 20.73   SURGEON:  Willey Blade, MD, MPH   ANESTHESIA:  Topical with tetracaine drops augmented with 1% preservative-free intracameral lidocaine.  ESTIMATED BLOOD LOSS: <1 mL   COMPLICATIONS:  None.   DESCRIPTION OF PROCEDURE:  The patient was identified in the holding room and transported to the operating room and placed in the supine position under the operating microscope.  The left eye was identified as the operative eye and it was prepped and draped in the usual sterile ophthalmic fashion.   A 1.0 millimeter clear-corneal paracentesis was made at the 5:00 position. 0.5 ml of preservative-free 1% lidocaine with epinephrine was injected into the anterior chamber.  There was a poor red reflex, so Trypan blue was instilled under an air bubble and rinsed out with BSS to stain the capsule for improved visualization.   The anterior chamber was filled with viscoelastic.  A 2.4 millimeter keratome was used to make a near-clear corneal incision at the 2:00 position.  A curvilinear capsulorrhexis was made with a cystotome and capsulorrhexis forceps.  Balanced salt solution was used to hydrodissect and hydrodelineate the nucleus.   Phacoemulsification was then used in stop and chop  fashion to remove the lens nucleus and epinucleus.  The remaining cortex was then removed using the irrigation and aspiration handpiece. Viscoelastic was then placed into the capsular bag to distend it for lens placement.  A lens was then injected into the capsular bag.  The remaining viscoelastic was aspirated.   Wounds were hydrated with balanced salt solution.  The anterior chamber was inflated to a physiologic pressure with balanced salt solution.  Intracameral vigamox 0.1 mL undiltued was injected into the eye and a drop placed onto the ocular surface.  No wound leaks were noted.  The patient was taken to the recovery room in stable condition without complications of anesthesia or surgery  Willey Blade 01/04/2023, 8:29 AM

## 2023-01-21 ENCOUNTER — Ambulatory Visit: Payer: 59 | Admitting: Anesthesiology

## 2023-01-21 NOTE — Anesthesia Preprocedure Evaluation (Addendum)
Anesthesia Evaluation  Patient identified by MRN, date of birth, ID band Patient awake    Reviewed: Allergy & Precautions, H&P , NPO status , Patient's Chart, lab work & pertinent test results  Airway Mallampati: IV  TM Distance: >3 FB Neck ROM: Full  Mouth opening: Limited Mouth Opening  Dental no notable dental hx. (+) Edentulous Lower, Edentulous Upper   Pulmonary asthma , sleep apnea , former smoker   Pulmonary exam normal breath sounds clear to auscultation       Cardiovascular hypertension, Normal cardiovascular exam Rhythm:Regular Rate:Normal     Neuro/Psych negative neurological ROS  negative psych ROS   GI/Hepatic negative GI ROS, Neg liver ROS,,,  Endo/Other  diabetesHypothyroidism    Renal/GU Renal diseasenegative Renal ROS  negative genitourinary   Musculoskeletal negative musculoskeletal ROS (+) Arthritis ,    Abdominal   Peds negative pediatric ROS (+)  Hematology negative hematology ROS (+)   Anesthesia Other Findings Gout  Hypertension Diabetes mellitus  Thyroid disease Glaucoma  Renal disorder Asthma  Arthritis Wears dentures  Previous cataract surgery 01-04-23 with Dr. Juel Burrow anesthesiologist Nobody able to place IV this visit; patient wishes to go home and reschedule.   Discussed w/Dr. Brooke Dare and patient that she can get PICC line in [place], and then could come here for cataract surgery, and remove PICC line prior to discharge. Plan to reschedule 02-22-23.     Reproductive/Obstetrics negative OB ROS                             Anesthesia Physical Anesthesia Plan  ASA: 3  Anesthesia Plan: MAC   Post-op Pain Management:    Induction: Intravenous  PONV Risk Score and Plan:   Airway Management Planned: Natural Airway and Nasal Cannula  Additional Equipment:   Intra-op Plan:   Post-operative Plan:   Informed Consent: I have reviewed the patients  History and Physical, chart, labs and discussed the procedure including the risks, benefits and alternatives for the proposed anesthesia with the patient or authorized representative who has indicated his/her understanding and acceptance.     Dental Advisory Given  Plan Discussed with: Anesthesiologist, CRNA and Surgeon  Anesthesia Plan Comments: (Patient consented for risks of anesthesia including but not limited to:  - adverse reactions to medications - damage to eyes, teeth, lips or other oral mucosa - nerve damage due to positioning  - sore throat or hoarseness - Damage to heart, brain, nerves, lungs, other parts of body or loss of life  Patient voiced understanding and assent.)        Anesthesia Quick Evaluation

## 2023-01-28 ENCOUNTER — Encounter: Payer: Self-pay | Admitting: Ophthalmology

## 2023-02-05 NOTE — Discharge Instructions (Signed)

## 2023-02-08 ENCOUNTER — Ambulatory Visit: Payer: 59 | Admitting: Anesthesiology

## 2023-02-08 ENCOUNTER — Encounter
Admission: RE | Disposition: A | Discharge status: Home / Self Care | Payer: Self-pay | Source: Home / Self Care | Attending: Ophthalmology

## 2023-02-08 ENCOUNTER — Encounter: Payer: Self-pay | Admitting: Ophthalmology

## 2023-02-08 ENCOUNTER — Other Ambulatory Visit: Payer: Self-pay

## 2023-02-08 ENCOUNTER — Ambulatory Visit
Admission: RE | Admit: 2023-02-08 | Discharge: 2023-02-08 | Disposition: A | Discharge status: Home / Self Care | Payer: 59 | Attending: Ophthalmology | Admitting: Ophthalmology

## 2023-02-08 DIAGNOSIS — G4730 Sleep apnea, unspecified: Secondary | ICD-10-CM | POA: Insufficient documentation

## 2023-02-08 DIAGNOSIS — H2511 Age-related nuclear cataract, right eye: Secondary | ICD-10-CM | POA: Insufficient documentation

## 2023-02-08 DIAGNOSIS — Z87891 Personal history of nicotine dependence: Secondary | ICD-10-CM | POA: Insufficient documentation

## 2023-02-08 DIAGNOSIS — E1136 Type 2 diabetes mellitus with diabetic cataract: Secondary | ICD-10-CM | POA: Diagnosis not present

## 2023-02-08 DIAGNOSIS — Z539 Procedure and treatment not carried out, unspecified reason: Secondary | ICD-10-CM | POA: Insufficient documentation

## 2023-02-08 DIAGNOSIS — I10 Essential (primary) hypertension: Secondary | ICD-10-CM | POA: Diagnosis not present

## 2023-02-08 DIAGNOSIS — J45909 Unspecified asthma, uncomplicated: Secondary | ICD-10-CM | POA: Insufficient documentation

## 2023-02-08 LAB — GLUCOSE, CAPILLARY: Glucose-Capillary: 155 mg/dL — ABNORMAL HIGH (ref 70–99)

## 2023-02-08 SURGERY — CATARACT EXTRACTION PHACO AND INTRAOCULAR LENS PLACEMENT (IOC)
Anesthesia: Monitor Anesthesia Care | Laterality: Right

## 2023-02-08 MED ORDER — MIDAZOLAM HCL 2 MG/2ML IJ SOLN
INTRAMUSCULAR | Status: AC
  Filled 2023-02-08: qty 2

## 2023-02-08 MED ORDER — ARMC OPHTHALMIC DILATING DROPS
OPHTHALMIC | Status: AC
  Filled 2023-02-08: qty 0.5

## 2023-02-08 MED ORDER — SODIUM CHLORIDE 0.9% FLUSH: 10.0000 mL | INTRAVENOUS | Status: DC | PRN

## 2023-02-08 MED ORDER — LACTATED RINGERS IV SOLN: INTRAVENOUS | Status: DC

## 2023-02-08 MED ORDER — FENTANYL CITRATE (PF) 100 MCG/2ML IJ SOLN
INTRAMUSCULAR | Status: AC
  Filled 2023-02-08: qty 2

## 2023-02-08 MED ORDER — TETRACAINE HCL 0.5 % OP SOLN
OPHTHALMIC | Status: AC
  Filled 2023-02-08: qty 4

## 2023-02-08 MED ORDER — TETRACAINE HCL 0.5 % OP SOLN
1.0000 [drp] | OPHTHALMIC | Status: DC | PRN
  Administered 2023-02-08 (×2): 1 [drp] via OPHTHALMIC

## 2023-02-08 MED ORDER — ARMC OPHTHALMIC DILATING DROPS
1.0000 | OPHTHALMIC | Status: DC | PRN
  Administered 2023-02-08 (×3): 1 via OPHTHALMIC

## 2023-02-08 NOTE — Progress Notes (Signed)
Pt procedure cancelled after numerous failed attempts of IV access. Pt requests to reschedule procedure. MD notified. Anesthesia aware. Pt discharged home.

## 2023-02-08 NOTE — H&P (Signed)
Despite multiple attempts by the anesthesia and preoperative team, intravenous access was not obtained; the patient's surgery was cancelled by anesthesia.

## 2023-02-08 NOTE — Plan of Care (Signed)
 [facility] Tonsillectomy/Adenoidectomy, Postoperative PEDS care plan entered in error.

## 2023-02-08 NOTE — Progress Notes (Signed)
Multiple unsuccessful  attempts at IV access, by prreop nurses, CRNAs and anesthesiologist who was successful last cataract surgery.   , patient understandably tired of venipuncture attempts. Patient states she is "hungry" and "tired of getting stuck today." Patient requests to "go home" and to reschedule..   Notified Dr. Brooke Dare that patient does not want any further attempts at venipuncture, and wishes to go home. Dr. Brooke Dare responded that patient can be rescheduled for 2 weeks (02-22-23)   Discussed w/Penny McVey, Director, and with patient and Dr. Brooke Dare, that should consider preop PICC, then patient comes to [facility] for surgery has cataract surgery  and IV removed prior to discharge home.  Patient agreeable to this plan.

## 2023-04-30 ENCOUNTER — Emergency Department
Admission: EM | Admit: 2023-04-30 | Discharge: 2023-04-30 | Disposition: A | Discharge status: Home / Self Care | Payer: 59 | Attending: Emergency Medicine | Admitting: Emergency Medicine

## 2023-04-30 ENCOUNTER — Other Ambulatory Visit: Payer: Self-pay

## 2023-04-30 DIAGNOSIS — M542 Cervicalgia: Secondary | ICD-10-CM | POA: Insufficient documentation

## 2023-04-30 DIAGNOSIS — E119 Type 2 diabetes mellitus without complications: Secondary | ICD-10-CM | POA: Insufficient documentation

## 2023-04-30 DIAGNOSIS — H9203 Otalgia, bilateral: Secondary | ICD-10-CM | POA: Insufficient documentation

## 2023-04-30 DIAGNOSIS — I10 Essential (primary) hypertension: Secondary | ICD-10-CM | POA: Diagnosis not present

## 2023-04-30 NOTE — ED Provider Notes (Signed)
   [facility] Provider Note    Event Date/Time   First MD Initiated Contact with Patient 04/30/23 1530     (approximate)   History   Otalgia   HPI  Shari Malone is a 73 y.o. female with a history of hypertension, diabetes, gout who comes ED complaining of bilateral neck pain and ear pain for the past 4 days.  No fever or chills, no paresthesias motor weakness or vision changes.  No headache.  No trauma.  No difficulty breathing or swallowing     Physical Exam   Triage Vital Signs: ED Triage Vitals  Encounter Vitals Group     BP 04/30/23 1456 (!) 163/52     Systolic BP Percentile --      Diastolic BP Percentile --      Pulse Rate 04/30/23 1456 75     Resp 04/30/23 1456 18     Temp 04/30/23 1456 98.4 F (36.9 C)     Temp Source 04/30/23 1456 Oral     SpO2 04/30/23 1456 99 %     Weight --      Height --      Head Circumference --      Peak Flow --      Pain Score 04/30/23 1457 8     Pain Loc --      Pain Education --      Exclude from Growth Chart --     Most recent vital signs: Vitals:   04/30/23 1456  BP: (!) 163/52  Pulse: 75  Resp: 18  Temp: 98.4 F (36.9 C)  SpO2: 99%    General: Awake, no distress.  CV:  Good peripheral perfusion.  Regular rate rhythm Resp:  Normal effort.  Clear to auscultation bilaterally Abd:  No distention.  Other:  no cervical lymphadenopathy.  No midline spinal tenderness.  No meningismus.  External canals and TMs appear normal bilaterally.  There is nasal congestion.  No rash or inflammatory skin changes, moist oral mucosa.  No oropharyngeal erythema or swelling   ED Results / Procedures / Treatments   Labs (all labs ordered are listed, but only abnormal results are displayed) Labs Reviewed - No data to display   RADIOLOGY    PROCEDURES:  Procedures   MEDICATIONS ORDERED IN ED: Medications - No data to display   IMPRESSION / MDM / ASSESSMENT AND PLAN / ED COURSE  I reviewed the  triage vital signs and the nursing notes.                             Patient presents with  neck pain and ear pain.  Unremarkable vital signs and reassuring exam.  Suspect viral URI and musculoskeletal pain.  Doubt meningitis encephalitis dissection PTA RPA mastoiditis.  Recommend Tylenol, warm liquids, primary care follow-up      FINAL CLINICAL IMPRESSION(S) / ED DIAGNOSES   Final diagnoses:  Otalgia of both ears     Rx / DC Orders   ED Discharge Orders     None        Note:  This document was prepared using Dragon voice recognition software and may include unintentional dictation errors.   Sharman Cheek, MD 04/30/23 [phone]

## 2023-04-30 NOTE — ED Triage Notes (Addendum)
Pt c/o of bilateral ear and posterior neck pain x4 days. Pt is AOX4, NAD noted. Airway intact. Pt denies blurry vision, dizziness, CP, jaw pain, SHOB. Pt states the pain started after sleeping, describes pain as tight muscular.

## 2023-04-30 NOTE — ED Notes (Signed)
See triage notes. Patient c/o bilateral ear pain and posterior neck pain for the past four days.

## 2023-07-08 ENCOUNTER — Encounter: Payer: Self-pay | Admitting: Emergency Medicine

## 2023-07-08 ENCOUNTER — Emergency Department
Admission: EM | Admit: 2023-07-08 | Discharge: 2023-07-08 | Disposition: A | Discharge status: Home / Self Care | Attending: Emergency Medicine | Admitting: Emergency Medicine

## 2023-07-08 ENCOUNTER — Other Ambulatory Visit: Payer: Self-pay

## 2023-07-08 DIAGNOSIS — I10 Essential (primary) hypertension: Secondary | ICD-10-CM | POA: Insufficient documentation

## 2023-07-08 DIAGNOSIS — M1A9XX1 Chronic gout, unspecified, with tophus (tophi): Secondary | ICD-10-CM | POA: Insufficient documentation

## 2023-07-08 DIAGNOSIS — E119 Type 2 diabetes mellitus without complications: Secondary | ICD-10-CM | POA: Diagnosis not present

## 2023-07-08 DIAGNOSIS — L02511 Cutaneous abscess of right hand: Secondary | ICD-10-CM | POA: Diagnosis present

## 2023-07-08 MED ORDER — DOXYCYCLINE HYCLATE 100 MG PO TABS: 100.0000 mg | ORAL_TABLET | Freq: Two times a day (BID) | ORAL | 0 refills | Status: DC

## 2023-07-08 MED ORDER — DOXYCYCLINE HYCLATE 100 MG PO TABS
100.0000 mg | ORAL_TABLET | Freq: Once | ORAL | Status: AC
  Administered 2023-07-08: 100 mg via ORAL
  Filled 2023-07-08: qty 1

## 2023-07-08 NOTE — Discharge Instructions (Addendum)
 Please take your antibiotic as prescribed for its entire course to treat any infection.  Return to the emergency department for any increased swelling any redness or fever.  Otherwise please follow-up with your doctor in the next over days for recheck/reevaluation.  Please continue to take all of your gout medications as prescribed by your physician.

## 2023-07-08 NOTE — Progress Notes (Signed)
.  Patient discharged home Discharge instructions, when to follow up, and prescriptions reviewed with patient.  Patient verbalized understanding. Patient walked out with walker

## 2023-07-08 NOTE — ED Provider Notes (Signed)
   [facility] Provider Note    Event Date/Time   First MD Initiated Contact with Patient 07/08/23 1257     (approximate)  History   Chief Complaint: Abscess  HPI  Shari Malone is a 73 y.o. female with a past medical history of tophaceous gout, hypertension, diabetes, arthritis, presents to the emergency department for drainage/discharge from her right index finger.  Patient has a long history of gout, takes her gout medications as prescribed.  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  Over her PIP joint she has a large tophi of the right index finger, noticed a small little blister to the area with drainage today.  No tenderness redness or fever.  Physical Exam   Triage Vital Signs: ED Triage Vitals  Encounter Vitals Group     BP 07/08/23 1226 (!) 159/61     Systolic BP Percentile --      Diastolic BP Percentile --      Pulse Rate 07/08/23 1226 62     Resp 07/08/23 1226 18     Temp 07/08/23 1226 98.8 F (37.1 C)     Temp Source 07/08/23 1226 Oral     SpO2 07/08/23 1226 99 %     Weight 07/08/23 1227 185 lb (83.9 kg)     Height 07/08/23 1227 4\' 11"  (1.499 m)     Head Circumference --      Peak Flow --      Pain Score 07/08/23 1231 0     Pain Loc --      Pain Education --      Exclude from Growth Chart --     Most recent vital signs: Vitals:   07/08/23 1226  BP: (!) 159/61  Pulse: 62  Resp: 18  Temp: 98.8 F (37.1 C)  SpO2: 99%    General: Awake, no distress.  CV:  Good peripheral perfusion.  Resp:  Normal effort.   Other:  Patient has tophi located all fingers.  Patient has a small area on her right index finger with there is a small area of pointing with a mild amount of white discharge.   ED Results / Procedures / Treatments   MEDICATIONS ORDERED IN ED: Medications - No data to display   IMPRESSION / MDM / ASSESSMENT AND PLAN / ED COURSE  I reviewed the triage vital signs and the nursing notes.  Patient's  presentation is most consistent with acute illness / injury with system symptoms.  Patient presents to the emergency department for evaluation of drainage from her right index finger.  I was able to use a blunt tipped needle after cleaning with alcohol and do roofing the small blister/pointing area.  Patient had drainage of mostly a white chalky type discharge consistent with tophaceous discharge, however given the drainage we will cover with antibiotic as a precaution.  I was able to move a decent amount of the discharge for the patient.  She will follow-up with her doctor.  She will continue to take her gout medications.  FINAL CLINICAL IMPRESSION(S) / ED DIAGNOSES   Tophaceous gout   Note:  This document was prepared using Dragon voice recognition software and may include unintentional dictation errors.   Minna Antis, MD 07/08/23 1306

## 2023-07-08 NOTE — ED Triage Notes (Signed)
 Pt c/o wound to right index finger since last night after she noticed a "blister" on her knuckle. Pt has what appears to be a small abscess to medial knuckle on index finger with white exudate. Pt also has obvious tophaceous gout and says she is compliant with her meds and takes allopurinol daily. No pain.

## 2023-07-30 ENCOUNTER — Other Ambulatory Visit: Payer: Self-pay

## 2023-07-30 ENCOUNTER — Emergency Department
Admission: EM | Admit: 2023-07-30 | Discharge: 2023-07-30 | Disposition: A | Discharge status: Home / Self Care | Attending: Emergency Medicine | Admitting: Emergency Medicine

## 2023-07-30 DIAGNOSIS — I10 Essential (primary) hypertension: Secondary | ICD-10-CM | POA: Insufficient documentation

## 2023-07-30 DIAGNOSIS — E119 Type 2 diabetes mellitus without complications: Secondary | ICD-10-CM | POA: Diagnosis not present

## 2023-07-30 DIAGNOSIS — M1A9XX1 Chronic gout, unspecified, with tophus (tophi): Secondary | ICD-10-CM | POA: Diagnosis present

## 2023-07-30 MED ORDER — CEPHALEXIN 500 MG PO CAPS: 500.0000 mg | ORAL_CAPSULE | Freq: Four times a day (QID) | ORAL | 0 refills | Status: AC

## 2023-07-30 MED ORDER — CEPHALEXIN 500 MG PO CAPS
500.0000 mg | ORAL_CAPSULE | Freq: Once | ORAL | Status: AC
  Administered 2023-07-30: 500 mg via ORAL
  Filled 2023-07-30: qty 1

## 2023-07-30 NOTE — ED Triage Notes (Signed)
 Pt to ED via POV for c/o gout in fingers, blister with pus noted on finger on right hand. Pt denies pain.

## 2023-07-30 NOTE — Discharge Instructions (Signed)
 I have sent a new antibiotic to your pharmacy.  Please continue following up with your rheumatology team as needed.

## 2023-07-30 NOTE — ED Notes (Signed)
 Lab attempted and unable to get labs. Pt stuck twice by lab and once by EDT

## 2023-07-30 NOTE — ED Provider Notes (Signed)
 [facility] Provider Note    Event Date/Time   First MD Initiated Contact with Patient 07/30/23 1527     (approximate)   History   Wound Check   HPI Shari Malone is a 73 y.o. female with history of DM2, HTN, tophaceous gout, HLD presenting today for wound check.  Patient has known history of tophaceous gout with a spot on her right second digit.  Was seen a couple weeks ago where she had unroofing.  They sent her on doxycycline  but she only took 2 doses before stopping.  She came back in today because the scab at the spot had come back off and was having some redness.  No other symptoms.  Denies fever or chills.     Physical Exam   Triage Vital Signs: ED Triage Vitals  Encounter Vitals Group     BP 07/30/23 1514 (!) 161/60     Systolic BP Percentile --      Diastolic BP Percentile --      Pulse Rate 07/30/23 1514 68     Resp 07/30/23 1514 20     Temp 07/30/23 1514 97.7 F (36.5 C)     Temp src --      SpO2 07/30/23 1514 100 %     Weight 07/30/23 1517 182 lb (82.6 kg)     Height 07/30/23 1517 4' 11 (1.499 m)     Head Circumference --      Peak Flow --      Pain Score 07/30/23 1517 0     Pain Loc --      Pain Education --      Exclude from Growth Chart --     Most recent vital signs: Vitals:   07/30/23 1514  BP: (!) 161/60  Pulse: 68  Resp: 20  Temp: 97.7 F (36.5 C)  SpO2: 100%   I have reviewed the vital signs. General:  Awake, alert, no acute distress. Head:  Normocephalic, Atraumatic. EENT:  PERRL, EOMI, Oral mucosa pink and moist, Neck is supple. Cardiovascular: Regular rate, 2+ distal pulses. Respiratory:  Normal respiratory effort, symmetrical expansion, no distress.   Extremities:  Moving all four extremities through full ROM without pain.   Neuro:  Alert and oriented.  Interacting appropriately.   Skin: Tophaceous gout noted throughout her bilateral fingers.  Slight unroofing on one side with slight surrounding  cellulitis Psych: Appropriate affect.    ED Results / Procedures / Treatments   Labs (all labs ordered are listed, but only abnormal results are displayed) Labs Reviewed - No data to display    EKG    RADIOLOGY    PROCEDURES:  Critical Care performed: No  Procedures   MEDICATIONS ORDERED IN ED: Medications  cephALEXin  (KEFLEX ) capsule 500 mg (has no administration in time range)     IMPRESSION / MDM / ASSESSMENT AND PLAN / ED COURSE  I reviewed the triage vital signs and the nursing notes.                              Differential diagnosis includes, but is not limited to, tophaceous gout, cellulitis  Patient's presentation is most consistent with acute, uncomplicated illness.  Patient is a 73 year old female with history of tophaceous gout presenting today for the same.  Spot where she had recent unroofing had the scab come off with some bleeding.  No active bleeding at this time.  Otherwise appears  consistent with tophaceous gout.  Slight erythema around the area and she was not able to finish the previous antibiotic course.  Will transition her antibiotic to a different 1 to see if she tolerates it better.  Otherwise told to follow-up with her rheumatology team for ongoing outpatient care.  She is agreeable with this plan.     FINAL CLINICAL IMPRESSION(S) / ED DIAGNOSES   Final diagnoses:  Tophaceous gout     Rx / DC Orders   ED Discharge Orders          Ordered    cephALEXin  (KEFLEX ) 500 MG capsule  4 times daily        07/30/23 1611             Note:  This document was prepared using Dragon voice recognition software and may include unintentional dictation errors.   Malvina Alm DASEN, MD 07/30/23 [phone]

## 2023-10-25 NOTE — Progress Notes (Signed)
 CC:  Chief Complaint  Patient presents with  . New Patient  . Nail Problem    Diabetic foot care    Shari Malone is a 73 y.o. with a complaint of being a diabetic foot checkup and footcare.  Denies any numbness associated with her diabetes.  Does complain of thick painful toenails and all of her toes causing pain and pressure in her shoes when walking.   Pt has tried the following treatment(s): None.     PMH: Past Medical History:  Diagnosis Date  . Arthritis   . Asthma without status asthmaticus (HHS-HCC)   . Chicken pox   . Diabetes mellitus type 2, uncomplicated (CMS/HHS-HCC)   . Glaucoma   . Glaucoma   . Gout   . Gout, joint   . Hypertension   . Renal disorder   . Renal disorder   . Thyroid disease     Medication: Current Outpatient Medications on File Prior to Visit  Medication Sig Dispense Refill  . albuterol (ACCUNEB) 0.63 mg/3 mL nebulizer solution 0.63 mg every 6 (six) hours as needed.   (Patient not taking: Reported on 10/21/2023)    . albuterol 90 mcg/actuation inhaler INHALE 2 PUFFS EVERY 4 HOURS AS NEEDED FOR WHEEZING (Patient not taking: Reported on 10/21/2023)    . allopurinoL (ZYLOPRIM) 100 MG tablet Take 1 tablet (100 mg total) by mouth once daily for 90 days 90 tablet 0  . amLODIPine (NORVASC) 10 MG tablet Take 10 mg by mouth once daily    . aspirin 81 MG chewable tablet Take 81 mg by mouth once daily.      SABRA atorvastatin (LIPITOR) 20 MG tablet Take 20 mg by mouth once daily  3  . blood glucose diagnostic test strip Use to check blood sugar twice daily - before breakfast and after dinner. ICD-10 E11.9 (Patient not taking: Reported on 10/21/2023)    . carvedilol (COREG) 6.25 MG tablet Take 6.25 mg by mouth 2 (two) times daily with meals.      . chlorthalidone 25 MG tablet Take 25 mg by mouth once daily.      . cyclobenzaprine  (FLEXERIL ) 5 MG tablet Take 5 mg by mouth 3 (three) times daily as needed   (Patient not taking: Reported on 10/21/2023)    .  FARXIGA 10 mg tablet Take 10 mg by mouth every morning    . glipiZIDE (GLUCOTROL) 10 MG tablet Take 10 mg by mouth every morning before breakfast.      . levothyroxine (SYNTHROID, LEVOTHROID) 112 MCG tablet Take 112 mcg by mouth once daily.      SABRA spironolactone (ALDACTONE) 25 MG tablet Take 25 mg by mouth once daily    . telmisartan (MICARDIS) 80 MG tablet Take 80 mg by mouth once daily    . traMADoL  (ULTRAM ) 50 mg tablet Take 50 mg by mouth every 12 (twelve) hours as needed     No current facility-administered medications on file prior to visit.    Allergies: Allergies as of 10/25/2023 - Reviewed 10/25/2023  Allergen Reaction Noted  . Amoxicillin Rash 01/03/2015  . Penicillins Rash 01/03/2015    Surgical History: Past Surgical History:  Procedure Laterality Date  . CHOLECYSTECTOMY    . ENDOSCOPIC CARPAL TUNNEL RELEASE    . HYSTERECTOMY    . TUBAL LIGATION      Social History: Social History   Socioeconomic History  . Marital status: Widowed  Occupational History  . Occupation: Retired  Tobacco Use  . Smoking  status: Never  . Smokeless tobacco: Never  Vaping Use  . Vaping status: Never Used  Substance and Sexual Activity  . Alcohol use: No  . Drug use: No  . Sexual activity: Never    Partners: Male   Social Drivers of Health   Financial Resource Strain: Medium Risk (09/25/2022)   Received from [facility]   Overall Financial Resource Strain (CARDIA)   . Difficulty of Paying Living Expenses: Somewhat hard  Food Insecurity: No Food Insecurity (10/07/2023)   Received from [facility]   Hunger Vital Sign   . Within the past 12 months, you worried that your food would run out before you got the money to buy more.: Never true   . Within the past 12 months, the food you bought just didn't last and you didn't have money to get more.: Never true  Transportation Needs: No Transportation Needs (10/07/2023)   Received from [facility]   [facility] -  Transportation   . Lack of Transportation (Medical): No   . Lack of Transportation (Non-Medical): No  Physical Activity: Sufficiently Active (10/07/2023)   Received from [facility]   Exercise Vital Sign   . On average, how many days per week do you engage in moderate to strenuous exercise (like a brisk walk)?: 4 days   . On average, how many minutes do you engage in exercise at this level?: 40 min  Stress: No Stress Concern Present (10/07/2023)   Received from [facility] - Occupational Stress Questionnaire   . Do you feel stress - tense, restless, nervous, or anxious, or unable to sleep at night because your mind is troubled all the time - these days?: Not at all  Social Connections: Moderately Integrated (10/07/2023)   Received from [facility]   Social Connection and Isolation Panel   . In a typical week, how many times do you talk on the phone with family, friends, or neighbors?: Twice a week   . How often do you get together with friends or relatives?: Once a week   . How often do you attend church or religious services?: More than 4 times per year   . Do you belong to any clubs or organizations such as church groups, unions, fraternal or athletic groups, or school groups?: Yes   . How often do you attend meetings of the clubs or organizations you belong to?: More than 4 times per year   . Are you married, widowed, divorced, separated, never married, or living with a partner?: Widowed  Housing Stability: Low Risk  (10/07/2023)   Received from [facility]   . Within the past 12 months, have you ever stayed: outside, in a car, in a tent, in an overnight shelter, or temporarily in someone else's home(i.e.couch-surfing)?: No   . Are you worried about losing your housing?: No   Social History   Tobacco Use  Smoking Status Never  Smokeless Tobacco Never      Review of Systems:  A comprehensive review of systems is  documented elsewehere in the encounter.   Review of Systems : Review of systems is documented in this chart under nurses   Objective: Constitutional: General appearance is well with no acute distress.  Normal mood and affect. Vascular:Left foot:  Dorsalis Pedis:  diminished Posterior Tibial:  diminished      Right foot:  Dorsalis Pedis:  diminished Posterior Tibial:  diminished  Neuro:  protective threshold with a monofilament wire grossly intact and symmetric to the forefoot in all digits  Derm:  The skin is warm dry and supple.  No focal erythema, edema, or ecchymosis .  All 10 toenails are significantly elongated, thick, dystrophic, discolored, brittle with subungual debris.  The hallux nails are ingrown along the borders bilateral with no signs of any drainage.  Ortho/MS:Painfree ROM to ankle, subtalar, midtarsal, and metatarsalphalangeal joints.  Full muscle strength to all major muscle groups of the lower extremity  Xrays None  Chart review: Prior labs reviewed today by myself.  Most recent hemoglobin A1c from February significantly elevated at 9.3, mildly trended upward from 8.0 October 2024 and 8.16 Aug 2022.  Assessment: Encounter Diagnoses  Name Primary?  . Diabetes mellitus without complication (CMS/HHS-HCC) Yes  . Ingrowing nail   . Mycotic toenails   . Pain in toes of both feet     Plan: Debridement of all 10 painful toenails in length and thickness sharply using toenail nippers with curette used to smooth the ingrown borders of nail.  Discussed with the patient proper diabetic footcare including close observation of the feet for signs or symptoms of any sores or infection.  Discussed the importance of getting her blood sugars under much better control.  Patient will return to clinic as needed.  No orders of the defined types were placed in this encounter.   Return if symptoms worsen or fail to improve.

## 2023-12-06 ENCOUNTER — Emergency Department

## 2023-12-06 ENCOUNTER — Emergency Department: Admission: EM | Admit: 2023-12-06 | Discharge: 2023-12-06 | Disposition: A | Discharge status: Home / Self Care

## 2023-12-06 ENCOUNTER — Other Ambulatory Visit: Payer: Self-pay

## 2023-12-06 ENCOUNTER — Encounter: Payer: Self-pay | Admitting: *Deleted

## 2023-12-06 DIAGNOSIS — W010XXA Fall on same level from slipping, tripping and stumbling without subsequent striking against object, initial encounter: Secondary | ICD-10-CM | POA: Insufficient documentation

## 2023-12-06 DIAGNOSIS — S80211A Abrasion, right knee, initial encounter: Secondary | ICD-10-CM | POA: Insufficient documentation

## 2023-12-06 DIAGNOSIS — S50311A Abrasion of right elbow, initial encounter: Secondary | ICD-10-CM | POA: Insufficient documentation

## 2023-12-06 DIAGNOSIS — J45909 Unspecified asthma, uncomplicated: Secondary | ICD-10-CM | POA: Diagnosis not present

## 2023-12-06 DIAGNOSIS — E1122 Type 2 diabetes mellitus with diabetic chronic kidney disease: Secondary | ICD-10-CM | POA: Insufficient documentation

## 2023-12-06 DIAGNOSIS — N189 Chronic kidney disease, unspecified: Secondary | ICD-10-CM | POA: Diagnosis not present

## 2023-12-06 DIAGNOSIS — I129 Hypertensive chronic kidney disease with stage 1 through stage 4 chronic kidney disease, or unspecified chronic kidney disease: Secondary | ICD-10-CM | POA: Diagnosis not present

## 2023-12-06 DIAGNOSIS — W19XXXA Unspecified fall, initial encounter: Secondary | ICD-10-CM

## 2023-12-06 DIAGNOSIS — M25521 Pain in right elbow: Secondary | ICD-10-CM | POA: Diagnosis present

## 2023-12-06 DIAGNOSIS — E039 Hypothyroidism, unspecified: Secondary | ICD-10-CM | POA: Insufficient documentation

## 2023-12-06 MED ORDER — BACITRACIN ZINC 500 UNIT/GM EX OINT
TOPICAL_OINTMENT | Freq: Once | CUTANEOUS | Status: AC
  Administered 2023-12-06: 1 via TOPICAL
  Filled 2023-12-06: qty 1.8

## 2023-12-06 NOTE — ED Provider Notes (Signed)
 [facility] Provider Note    Event Date/Time   First MD Initiated Contact with Patient 12/06/23 2032     (approximate)   History   Fall   HPI  Shari Malone is a 73 y.o. female  with a past medical history of type 2 diabetes, asthma, HTN, bilateral lower extremity edema, hypothyroidism, CKD, tophaceous gout, OA, presents to the emergency department after mechanical fall and hitting her right elbow and right knee on the concrete while trying to get out of her car.  Daughter is present in the room.  Denies fever, chills, headache, hitting her head, chest pain, abdominal pain, back or neck pain.  Patient has not taken anything for pain following the incident. Last tetanus shot within 5 years.  Patient is ambulatory on her own, does use a walker when outside of her home. Denies LOC, does take aspirin.   Physical Exam   Triage Vital Signs: ED Triage Vitals  Encounter Vitals Group     BP 12/06/23 1628 139/69     Girls Systolic BP Percentile --      Girls Diastolic BP Percentile --      Boys Systolic BP Percentile --      Boys Diastolic BP Percentile --      Pulse Rate 12/06/23 1628 80     Resp 12/06/23 1628 18     Temp 12/06/23 1628 97.9 F (36.6 C)     Temp Source 12/06/23 1628 Oral     SpO2 12/06/23 1628 97 %     Weight 12/06/23 1618 179 lb (81.2 kg)     Height 12/06/23 1618 4' 11 (1.499 m)     Head Circumference --      Peak Flow --      Pain Score 12/06/23 1618 5     Pain Loc --      Pain Education --      Exclude from Growth Chart --     Most recent vital signs: Vitals:   12/06/23 1628  BP: 139/69  Pulse: 80  Resp: 18  Temp: 97.9 F (36.6 C)  SpO2: 97%    General: Awake, in no acute distress. Appears stated age. Head: Normocephalic, atraumatic. Eyes: No scleral icterus or conjunctival injection. Neck: Supple, no nuchal rigidity. CV: Good peripheral perfusion. No edema.  Respiratory:Normal respiratory effort.  No respiratory  distress. CTAB. GI: Soft, non-distended, non-tender.  MSK: Normal ROM and  5/5 strength in b/l upper and lower extremities. Able to ambulate without assistance. Mildly TTP of right olecranon process with mild swelling. Skin:Warm, dry. Abrasions noted to right anterior knee and right elbow. Neurological: A&Ox4 to person, place, time, and situation. No focal deficits.   ED Results / Procedures / Treatments   Labs (all labs ordered are listed, but only abnormal results are displayed) Labs Reviewed - No data to display   EKG     RADIOLOGY Right knee x ray FINDINGS: No acute fracture or dislocation.   No aggressive osseous lesion.   There are mild-to-moderate degenerative changes of the knee joint in the form of mildly reduced tibio-femoral compartment joint space (medial compartment more than lateral), tibial spiking and tricompartmental osteophytosis. There is also meniscal chondrocalcinosis.   No knee effusion or focal soft tissue swelling.   No radiopaque foreign bodies.   IMPRESSION: No acute osseous abnormality of the right knee joint. Mild-to-moderate degenerative changes, as described above.   Right elbow x ray Narrative & Impression  CLINICAL DATA:  Fall.   EXAM: RIGHT ELBOW - COMPLETE 3+ VIEW   COMPARISON:  None Available.   FINDINGS: No acute fracture or dislocation.   No aggressive osseous lesion.   There are moderate to severe degenerative changes of the elbow joint.   No radiopaque foreign bodies.   There is mild to moderate lobulated tissue swelling superficial to the olecranon process of ulna, most commonly seen with olecranon bursitis.   IMPRESSION: 1. No acute osseous abnormality of the right elbow joint. 2. Moderate to severe degenerative changes of the elbow joint. 3. Mild to moderate lobulated tissue swelling superficial to the olecranon process of ulna, most commonly seen with olecranon bursitis.     PROCEDURES:  Critical Care  performed: No   Procedures   MEDICATIONS ORDERED IN ED: Medications  bacitracin  ointment (has no administration in time range)     IMPRESSION / MDM / ASSESSMENT AND PLAN / ED COURSE  I reviewed the triage vital signs and the nursing notes.                              Differential diagnosis includes, but is not limited to, mechanical fall, abrasion, olecranon bursitis, olecranon fracture, patella fracture  Patient's presentation is most consistent with acute complicated illness / injury requiring diagnostic workup.  Is a 73 year old female presented today after mechanical fall with right knee and right elbow abrasions.  Denies hitting her head.  GCS of 15.  Right knee with mild to moderate degenerative changes listed above, but no acute fracture or dislocation.  Right elbow x-ray with moderate to severe degenerative changes, lobulated tissue swelling superficial to the olecranon process.  Mild tenderness to palpation along this area on exam however there is an overlying abrasion. I personally viewed and interpreted the x-rays and agree with the radiologist's reports. Patient has no signs or symptoms of infection.  Vital signs within normal range.  Triple antibiotic ointment placed on abrasions and Band-Aids applied.  Would like her to follow-up with her primary care provider following today's visit.  The patient may return to the emergency department for any new, worsening, or concerning symptoms. Patient was given the opportunity to ask questions; all questions were answered. Emergency department return precautions were discussed with the patient.  Patient is in agreement to the treatment plan.  Patient is stable for discharge.   FINAL CLINICAL IMPRESSION(S) / ED DIAGNOSES   Final diagnoses:  Fall, initial encounter  Abrasion of right elbow, initial encounter  Abrasion of right knee, initial encounter     Rx / DC Orders   ED Discharge Orders     None        Note:  This  document was prepared using Dragon voice recognition software and may include unintentional dictation errors.     Sheron Salm, PA-C 12/06/23 2109    Clarine Ozell LABOR, MD 12/08/23 [phone]

## 2023-12-06 NOTE — ED Notes (Signed)
 Bacitracin  ointment applied to R knee and R elbow. Both sites covered with an adhesive bandage.

## 2023-12-06 NOTE — ED Notes (Signed)
 Pts daughter, Madelin, is taking pt home.

## 2023-12-06 NOTE — Discharge Instructions (Addendum)
 You have been seen in the Emergency Department (ED) today for a fall.  Your work up does not show any concerning injuries.  Please take Tylenol  as needed for your pain. You may use triple antibiotic ointment and band aids at home on your scrapes.  Please follow up with your doctor regarding today's Emergency Department (ED) visit and your recent fall.    Return to the ED if you have any headache, confusion, slurred speech, weakness/numbness of any arm or leg, or any increased pain.

## 2023-12-06 NOTE — ED Triage Notes (Signed)
 Pt to triage via wheelchair.  Pt states she tripped and fell today.  No loc  pt has right knee and right elbow pain.  Abrasions to right knee and right elbow.  Pt denies neck or back pain.  Pt alert  speech clear.

## 2024-01-26 NOTE — Progress Notes (Signed)
 PCP:  Lawernce Schuyler LABOR, MD [facility] Rheumatology  02/03/2024  Chief Complaint: Resistant Hypertension and CKD  HPI:  Ms. Shari Malone is seen today in follow-up. I last saw her on 10/04/23  She saw Endocrine on 05/27/23. She hasn't been back to see them due to transportation issues.  She has a BM every other day. She tends toward constipation.  She has occasional pain in her hands. She is not using NSAIDs.  She admits to nocturia x 1-2.  She feels that her edema is stable (gets better with recumbency)  She does not have any home BP readings.  Background:   She was diagnosed with hypertension in her 30's. She has been pregnant x 3 and denies any problems with pre-eclampsia.   She was a full-term baby.  ROS:  CONSTITUTIONAL: denies fevers or chills, denies unintentional weight loss CARDIOVASCULAR: denies chest pain, denies dyspnea on exertion, denies leg edema GASTROINTESTINAL: denies nausea, denies vomiting, denies anorexia GENITOURINARY: denies dysuria, denies hematuria, denies decreased urinary stream All systems reviewed and are negative except as listed above.  PAST MEDICAL HISTORY: Past Medical History:  Diagnosis Date  . Diabetes mellitus (CMS-HCC)   . Disease of thyroid gland   . Eczema   . Glaucoma   . Gout   . Hyperlipemia   . Hypertension     ALLERGIES Amoxicillin and Penicillins  MEDICATIONS: Current Outpatient Medications  Medication Sig Dispense Refill  . acetaminophen  (TYLENOL ) 325 MG tablet Take 2 tablets (650 mg total) by mouth every six (6) hours as needed for pain. 60 tablet 2  . allopurinol (ZYLOPRIM) 100 MG tablet Take 1 tablet (100 mg total) by mouth daily. 90 tablet 3  . amlodipine (NORVASC) 10 MG tablet TAKE 1 TABLET BY MOUTH EVERY DAY 90 tablet 3  . aspirin 81 MG chewable tablet Chew 1 tablet (81 mg total) daily.    SABRA atorvastatin (LIPITOR) 20 MG tablet TAKE 1 TABLET BY MOUTH EVERY DAY AT NIGHT 90 tablet 3  . blood sugar diagnostic (ACCU-CHEK  GUIDE TEST STRIPS) Strp USE AS DIRECTED TWICE A DAY 100 strip 3  . blood sugar diagnostic (GLUCOSE BLOOD) Strp Use as directed daily Diagnosis E11.9 Brand of choice 100 each 11  . blood-glucose meter kit One glucose meter, brand of choice 1 each 0  . blood-glucose meter,continuous Misc 1 each by Miscellaneous route once for 1 dose. 1 each 0  . blood-glucose sensor (FREESTYLE LIBRE 3 PLUS SENSOR) Devi Replace every 15 days. #2 sensors per month. 2 each 3  . carvedilol (COREG) 3.125 MG tablet TAKE 1 TABLET (3.125 MG TOTAL) BY MOUTH TWO (2) TIMES A DAY. 180 tablet 2  . chlorthalidone (HYGROTON) 25 MG tablet Take 1 tablet (25 mg total) by mouth daily. 90 tablet 1  . dapagliflozin propanediol (FARXIGA) 10 mg Tab tablet Take 1 tablet (10 mg total) by mouth every morning. 90 tablet 3  . glipiZIDE (GLUCOTROL) 5 MG tablet Take 1 tablet (5 mg total) by mouth Two (2) times a day (30 minutes before a meal). 180 tablet 1  . lancets Misc Test once daily and as directed. 100 each 3  . levothyroxine (SYNTHROID) 125 MCG tablet Take 1 tablet (125 mcg total) by mouth daily. 90 tablet 3  . MEDICAL SUPPLY ITEM 1 pair diabetic shoes.  Dx 250.00 1 Units 0  . MEDICAL SUPPLY ITEM 1 pair diabetic shoes.  Dx 250.00 1 Units 0  . spironolactone (ALDACTONE) 25 MG tablet TAKE 1 TABLET (25 MG  TOTAL) BY MOUTH DAILY. 90 tablet 2  . telmisartan (MICARDIS) 80 MG tablet Take 1 tablet (80 mg total) by mouth daily. 90 tablet 3   No current facility-administered medications for this visit.   Wt Readings from Last 6 Encounters:  02/03/24 82.6 kg (182 lb)  01/21/24 82.1 kg (181 lb 1.6 oz)  12/31/23 81.1 kg (178 lb 12.8 oz)  10/07/23 81.2 kg (179 lb)  10/04/23 81.2 kg (179 lb)  05/27/23 83.3 kg (183 lb 9.6 oz)     PHYSICAL EXAM: Vitals:   02/03/24 1455  BP: 163/59  Pulse: 58       CONSTITUTIONAL: Alert,well appearing, no distress HEENT: Moist mucous membranes EYES: Sclerae anicteric. NECK: Supple CARDIOVASCULAR:  Regular, normal S1/S2 heart sounds, no murmurs, no rubs.  PULM: Clear to auscultation bilaterally GASTROINTESTINAL: Soft, active bowel sounds, nontender EXTREMITIES: + ankle edema, deformities of hands SKIN: No rashes or lesions NEUROLOGIC: No focal motor or sensory deficits  MEDICAL DECISION MAKING  Results for orders placed or performed in visit on 01/31/24  Hemoglobin  Result Value Ref Range   HGB 11.2 (L) 11.3 - 14.9 g/dL  Basic metabolic panel  Result Value Ref Range   Sodium 137 135 - 145 mmol/L   Potassium 5.0 (H) 3.4 - 4.8 mmol/L   Chloride 104 98 - 107 mmol/L   CO2 20.0 20.0 - 31.0 mmol/L   Anion Gap 13 5 - 14 mmol/L   BUN 32 (H) 9 - 23 mg/dL   Creatinine 7.64 (H) 9.44 - 1.02 mg/dL   BUN/Creatinine Ratio 14    eGFR CKD-EPI (2021) Female 21 (L) >=60 mL/min/1.7m2   Glucose 315 (H) 70 - 179 mg/dL   Calcium 9.7 8.7 - 89.5 mg/dL   *Note: Due to a large number of results and/or encounters for the requested time period, some results have not been displayed. A complete set of results can be found in Results Review.     No results for input(s): NA, K, CL, BUN, CREATININE, GFR, GLU in the last 24 hours.  Invalid input(s): C02   Lab Results  Component Value Date   PTH 162.2 (H) 09/27/2023   CALCIUM 9.7 01/31/2024     2008 Renal US : Right kidney 9.0 cm and Left kidney 8.9 cm with increased echo  07/28/10 Adrenal thickening noted on CT Scan  05/20/12 PRA 2.4, metanephrines nl, ALDO 13  06/02/19 HgbAIC 7.5  02/12/20 TSH nl, K 3.7, C02 22, Cr 1.5, urine alb:cr 961  03/22/20 TTE   04/01/20 PTH 133, PRA 6.9, Aldo 8.8, uric acid 10.1, SPEP neg, FLC neg, Cr 1.45  05/29/20 HgbAIC 7.9, uric acid 9.6, Hgb 12.7  05/29/20 Renal Duplex - No RAS  06/17/20 Cr 1.59, eGFR 33-38 ml/min  Renal US  06/23/20 Right kidney 8.4 cm and Left kidney 9.5 cm  06/24/20 Cr 1.87  07/08/20 Na 138, K 4.2, C02 21, Cr 1.4, Ca 10.6, eGFR 38-44 ml/min, Hgb 11.0  09/02/20 uric acid 10.5,  HgbAIC 8.5, Hgb 10.4, Ca 9.8, Phos 3.7, Alb 4.3, vit D 17  11/29/20 K 4.8, C02 18, Cr 1.7, eGFR 31 ml/min  01/02/21 uric acid 8.1, Cr 2.1, eGFR 28 ml/min  06/11/21 HgbAIC 8.4  07/17/21 uric acid 6.7  09/17/21 K 5.4, C02 17, Cr 2.03, Ca 10.5, Alb 4.8  10/30/21 K 5.0, C02 21, Cr 2.01, eGFR 26 ml/min, Hgb 9.5, PTH 205, Phos 3.3, vit D 16  11/04/21 urine pr:cr 1.8, urine alb:cr 1097  03/08/22 K 4.9, C02 20, Cr  2.4, 21 ml/min, Hgb 12.7, Ca 10.2, uric acid 10.3  05/26/21 AIC 7.2, urine pr:cr 1.8, urine alb:cr 1.2, PTH 156, Hgb 10.9, K 4.6, C02 21,Cr 1.6, 33 ml/min, Ca 9.6, Phos 3.7, Alb 4.3, vit D 22  07/06/22 K 4.5, C02 22, Cr 2.04, 25 ml/min  08/27/22 urine pr:cr 0.321, urine alb:cr 98.6, we weren't able to draw her blood today as the nurses couldn't get it  09/03/22 uric acid 9.5, Phos 3.5, K 5.0, C02 19, Cr 2.09, 25 ml/min, AIC 8.5  09/25/22 Cr 2.28  10/12/22 Na 134, K 4.8, C02 20, Cr 2.48, 20 ml/min, Hgb 11.4  01/26/23 K 5.1, C02 20, Cr 2.29, 22 ml/min, Ca 10.5, Phos 3.8, PTH 131, Hgb 11.5  02/11/23 AIC 8.0  05/20/23 AIC 9.3  05/26/23 K 4.9, C02 21, Cr 1.99, 26 ml/min, Ca 10.7, Phos 3.2, Alb 4.3, vit D 30, Hgb 11.1, urine alb:cr 38.1, urine pr:cr neg  09/28/23 K 5.5, C02 18, Cr 2.1, 24 ml/min, Ca 10.3, PTH 162, Phos 3.2, Alb 4.4, Ca 10.3  01/31/24 K 5.0, C02 20, Cr 2.35, 21 ml/min, Hgb 11.2   ASSESSMENT/PLAN:   Ms.Shari Malone is a 73 y.o. year old patient with a past medical history significant for HTN who is being seen for a follow-up visit.   1. HTN- BP elevated today in the office. No home readings available.  A. Secondary Hypertension-   Secondary Shari/up is neg. PRA/ALDO non-diagnostic off aldactone. Renal Duplex neg along with TTE for any aorta abnl. Plasma metanephrines were neg in the past. No further secondary Shari/up needed.  She was diagnosed with mild OSA. Not currently on CPAP.  B. Lifestyle Modification- Discussed importance of following a lower sodium diet. She mostly  eats at home. Son and daughter prepares food for her at home.  C. Pharmacologic Therapy- She is currently on Norvasc 10 mg every day, CTD 25 mg every day, Aldactone 25 mg qd, Telmisartan 80 mg every day, Coreg 3.125 mg bid. She did not bring in her meds today. [facility] and she is up to date on her refills.   She is very much maxed out on several antihtn meds. I am encouraged by the refill records and hope that she is taking her medications regularly. Unfortunately, home readings are not available and we are reliant on office readings.   I am not sure what else to do at this juncture. Her heart rate doesn't allow for a clonidine patch and doesn't allow for titration of coreg. Her elevated potassium level limits titration of spironolactone. She had some adrenal thickening on CT imaging in the distant past. We could consider the addition of a potassium binder to allow for increased dose of spironolactone. I made no changes today. She is taking her Coreg just once a day and I told her to take it twice per day (3.125 mg bid). Consideration can be given for changing this to once a day metoprolol XL (succinate)  She plans to incorporate more walking and attention to her food choices  D. BP Monitoring- Because of her arthritis, it is difficult her to check her BP at home.  E. Medication Adherence- [facility] [address]. Per their records, she is up to date on refills. I spoke with the pharmacist on 02/03/24.  She denies that cost if a barrier to getting her meds.  2. Gout- She reports that she is taking Allopurinol 100 mg every other day (rather  than everyday). She is not taking NSAIDs. She follows with [facility]. She uses Tylenol .   3. DM2- Diagnosed around 2000. No known retinopathy or neuropathy. On glipizide and Farxiga 10 mg every day. Difficult for her to check glucose levels at home. Endocrine started Rybelus in Feb. She stopped this medication because  she thought it was making her feel bad. We discussed her last AIC level and the importance of DM control. She is meeting with [facility] in November  4. H/o tobacco use  5. HLD- On lipitor 20 mg three times per week (or every other day)  6. [id number]- Baseline Cr 2's. We discussed her labs from 01/31/24.  Based on [facility] and previous labs, she has a 50% risk of needing dialysis in 5 years and a 23% risk in 2 years. I shared with her these numbers. Because of her limited dexterity, I feel that in-center HD would be her best option. She lives 15 min from the [facility] on [address]. I gave her some information about in-center HD at the previous visit. Will refer for perm access when her GFR is 15-20. Tammy is aware of the plan.   Her right kidney is smaller than previously in 2008 on ultrasound. Has proteinuria. Suspect related to diabetes and hypertension. SPEP/FLC neg. On Farxiga. She is on a ARB and MRA. GLP1-RA would be the only additional option but injections are difficult. She stopped the oral Rybelus.  Her level of albuminuria and proteinuria is much improved on Farixga.  7. Proteinuria/albuminuria- She needs good DM and HTN control. On ARB and MRA. On Farxiga 10 mg every day.   8. Edema- Much improved. Has proteinuria.  9. Hyperparathyroidism- Suspect related to kidney disease. Replete vit D level to > 30. Vit D level checked in Feb. Continiue to monitor levels.  10. Vit D def- See #9  Taking OTC Vit D 3000 units every day. She admits that she needs to pick it up.  11. Hyperkalemia- On ARB and MRA. I gave her a list of food that are high and low in potassium and asked her to refrain from the high potassium foods. She admits to eating bananas and oranges  12. Metabolic acidosis- Started sodium bicarb 650 mg bid at a previous visit but did not tolerate due to edema she says. She will take 1 tsp of baking soda instead. I again reminded her of this today.  13. Anemia  I personally spent 20  minutes face-to-face and non-face-to-face in the care of this patient, which includes all pre, intra, and post visit time on the date of service.   Ms.Shari Malone will follow up in 4 months. Check BMP, urine alb:cr, urine pr:cr at that time.

## 2024-02-03 ENCOUNTER — Other Ambulatory Visit: Payer: Self-pay

## 2024-02-03 ENCOUNTER — Emergency Department
Admission: EM | Admit: 2024-02-03 | Discharge: 2024-02-03 | Disposition: A | Discharge status: Home / Self Care | Attending: Emergency Medicine | Admitting: Emergency Medicine

## 2024-02-03 ENCOUNTER — Emergency Department

## 2024-02-03 DIAGNOSIS — S61230A Puncture wound without foreign body of right index finger without damage to nail, initial encounter: Secondary | ICD-10-CM | POA: Diagnosis not present

## 2024-02-03 DIAGNOSIS — W231XXA Caught, crushed, jammed, or pinched between stationary objects, initial encounter: Secondary | ICD-10-CM | POA: Diagnosis not present

## 2024-02-03 DIAGNOSIS — S61239A Puncture wound without foreign body of unspecified finger without damage to nail, initial encounter: Secondary | ICD-10-CM

## 2024-02-03 DIAGNOSIS — S6991XA Unspecified injury of right wrist, hand and finger(s), initial encounter: Secondary | ICD-10-CM | POA: Diagnosis present

## 2024-02-03 MED ORDER — DOXYCYCLINE HYCLATE 100 MG PO TABS: 100.0000 mg | ORAL_TABLET | Freq: Two times a day (BID) | ORAL | 0 refills | Status: AC

## 2024-02-03 MED ORDER — BACITRACIN ZINC 500 UNIT/GM EX OINT
TOPICAL_OINTMENT | Freq: Once | CUTANEOUS | Status: AC
  Administered 2024-02-03: 1 via TOPICAL
  Filled 2024-02-03: qty 0.9

## 2024-02-03 MED ORDER — DOXYCYCLINE HYCLATE 100 MG PO TABS
100.0000 mg | ORAL_TABLET | Freq: Once | ORAL | Status: AC
  Administered 2024-02-03: 100 mg via ORAL
  Filled 2024-02-03: qty 1

## 2024-02-03 NOTE — ED Provider Notes (Signed)
 [facility] Emergency Department Provider Note     Event Date/Time   First MD Initiated Contact with Patient 02/03/24 1625     (approximate)   History   Finger Injury   HPI  Shari Malone is a 73 y.o. female presents to the ED for evaluation of a puncture wound to the right index finger.  Patient was stuck by the metal underwire of her bra, while trying to remove it.  She presents with concern for potential retained metallic foreign body to the finger.  She denies any injury at this time.  Patient with a history of chronic tophaceous changes to the hands bilaterally.  Physical Exam   Triage Vital Signs: ED Triage Vitals  Encounter Vitals Group     BP 02/03/24 1548 (!) 166/58     Girls Systolic BP Percentile --      Girls Diastolic BP Percentile --      Boys Systolic BP Percentile --      Boys Diastolic BP Percentile --      Pulse Rate 02/03/24 1548 62     Resp 02/03/24 1548 17     Temp 02/03/24 1548 98.8 F (37.1 C)     Temp Source 02/03/24 1548 Oral     SpO2 02/03/24 1548 100 %     Weight 02/03/24 1548 182 lb (82.6 kg)     Height 02/03/24 1548 4' 11 (1.499 m)     Head Circumference --      Peak Flow --      Pain Score 02/03/24 1547 5     Pain Loc --      Pain Education --      Exclude from Growth Chart --     Most recent vital signs: Vitals:   02/03/24 1548  BP: (!) 166/58  Pulse: 62  Resp: 17  Temp: 98.8 F (37.1 C)  SpO2: 100%    General Awake, no distress. NAD HEENT NCAT. PERRL. EOMI. No rhinorrhea. Mucous membranes are moist.  CV:  Good peripheral perfusion.  RESP:  Normal effort.  MSK:  Bilateral changes to the hands consistent with tophaceous gout.  Patient with a single wound over the right index finger knuckle.  Normal cap refill and sensation intact.   ED Results / Procedures / Treatments   Labs (all labs ordered are listed, but only abnormal results are displayed) Labs Reviewed - No data to  display   EKG    RADIOLOGY  I personally viewed and evaluated these images as part of my medical decision making, as well as reviewing the written report by the radiologist.  ED Provider Interpretation: No acute bony changes.  No metallic foreign body noted  DG Finger Index Right Result Date: 02/03/2024 CLINICAL DATA:  injury, concern for piece of metal stuck in finger. EXAM: RIGHT INDEX FINGER 2+V COMPARISON:  06/08/2016 FINDINGS: Diffuse osteopenia.No acute fracture or dislocation. Bony ankylosis of the second PIP joint. Severe joint space loss of the first interphalangeal joint. Similar appearance of multiple large erosions centered at the second MCP joint. Severe soft tissue swelling throughout the first digit. No radiopaque foreign body. IMPRESSION: 1. No acute fracture or dislocation.  No radiopaque foreign body. 2. Similar appearance of the severe arthropathy involving the visualized digits, consistent with patient's history of gout. Electronically Signed   By: Rogelia Myers M.D.   On: 02/03/2024 16:30    PROCEDURES:  Critical Care performed: No  Procedures   MEDICATIONS ORDERED IN  ED: Medications  doxycycline  (VIBRA -TABS) tablet 100 mg (100 mg Oral Given 02/03/24 1711)  bacitracin  ointment (1 Application Topical Given 02/03/24 1711)     IMPRESSION / MDM / ASSESSMENT AND PLAN / ED COURSE  I reviewed the triage vital signs and the nursing notes.                              Differential diagnosis includes, but is not limited to, fracture, dislocation, retained metallic foreign body  Patient's presentation is most consistent with acute complicated illness / injury requiring diagnostic workup.  Patient's diagnosis is consistent with puncture wound to the right index finger from the metal underwire of her bra.  Patient's clinical picture is complicated by her chronic and severe tophaceous gouty changes to the hands.  No x-ray evidence of any bony disruption or retained  metallic foreign body.  She will be treated empirically with Keflex  for concern for possible secondary cellulitis.  Patient is to follow up with her primary provider as needed or otherwise directed. Patient is given ED precautions to return to the ED for any worsening or new symptoms.     FINAL CLINICAL IMPRESSION(S) / ED DIAGNOSES   Final diagnoses:  Puncture wound of finger of right hand, initial encounter     Rx / DC Orders   ED Discharge Orders          Ordered    doxycycline  (VIBRA -TABS) 100 MG tablet  2 times daily        02/03/24 1701             Note:  This document was prepared using Dragon voice recognition software and may include unintentional dictation errors.    Loyd Candida LULLA Aldona, PA-C 02/04/24 0003    Waymond Lorelle Cummins, MD 02/04/24 [phone]

## 2024-02-03 NOTE — Discharge Instructions (Addendum)
 Your x-ray does not show any evidence of foreign body in your finger or any bony injury.  Keep the finger wound clean, dry, and covered.  Take antibiotic twice daily as prescribed.  Follow-up with your primary provider for ongoing evaluation.

## 2024-02-03 NOTE — ED Triage Notes (Signed)
 PT states a little over an hour ago, she was taking her bra off and the metal wire got caught in her right index finger. Pt is concerned a piece of metal may be stuck. Pt Arrives AxOx4. Denies any other complaints.
